# Patient Record
Sex: Male | Born: 1980 | Race: White | Hispanic: No | Marital: Married | Smoking: Former smoker
Health system: Southern US, Community
[De-identification: ages and names within clinical notes are randomized; demographics above are authoritative.]

## PROBLEM LIST (undated history)

## (undated) DIAGNOSIS — I10 Essential (primary) hypertension: Secondary | ICD-10-CM

## (undated) DIAGNOSIS — E039 Hypothyroidism, unspecified: Secondary | ICD-10-CM

## (undated) DIAGNOSIS — F410 Panic disorder [episodic paroxysmal anxiety] without agoraphobia: Secondary | ICD-10-CM

## (undated) DIAGNOSIS — F419 Anxiety disorder, unspecified: Secondary | ICD-10-CM

## (undated) DIAGNOSIS — Z8719 Personal history of other diseases of the digestive system: Secondary | ICD-10-CM

## (undated) DIAGNOSIS — E669 Obesity, unspecified: Secondary | ICD-10-CM

## (undated) HISTORY — DX: Personal history of other diseases of the digestive system: Z87.19

## (undated) HISTORY — DX: Obesity, unspecified: E66.9

## (undated) HISTORY — DX: Anxiety disorder, unspecified: F41.9

## (undated) HISTORY — DX: Essential (primary) hypertension: I10

## (undated) HISTORY — DX: Panic disorder (episodic paroxysmal anxiety): F41.0

## (undated) HISTORY — DX: Hypothyroidism, unspecified: E03.9

---

## 2003-09-16 ENCOUNTER — Other Ambulatory Visit: Payer: Self-pay

## 2005-08-19 ENCOUNTER — Ambulatory Visit: Payer: Self-pay | Admitting: Family Medicine

## 2005-11-20 ENCOUNTER — Other Ambulatory Visit: Payer: Self-pay

## 2005-11-20 ENCOUNTER — Emergency Department: Payer: Self-pay | Admitting: Unknown Physician Specialty

## 2005-11-23 ENCOUNTER — Emergency Department: Payer: Self-pay | Admitting: Emergency Medicine

## 2005-11-23 ENCOUNTER — Other Ambulatory Visit: Payer: Self-pay

## 2005-12-24 ENCOUNTER — Emergency Department: Payer: Self-pay | Admitting: Emergency Medicine

## 2005-12-24 ENCOUNTER — Other Ambulatory Visit: Payer: Self-pay

## 2006-12-26 ENCOUNTER — Other Ambulatory Visit: Payer: Self-pay

## 2006-12-26 ENCOUNTER — Emergency Department: Payer: Self-pay | Admitting: Emergency Medicine

## 2006-12-27 ENCOUNTER — Ambulatory Visit: Payer: Self-pay | Admitting: Emergency Medicine

## 2007-09-05 ENCOUNTER — Emergency Department: Payer: Self-pay | Admitting: Emergency Medicine

## 2007-09-05 ENCOUNTER — Other Ambulatory Visit: Payer: Self-pay

## 2007-09-06 ENCOUNTER — Ambulatory Visit: Payer: Self-pay | Admitting: Emergency Medicine

## 2009-01-05 ENCOUNTER — Emergency Department: Payer: Self-pay | Admitting: Emergency Medicine

## 2009-06-08 ENCOUNTER — Emergency Department: Payer: Self-pay | Admitting: Emergency Medicine

## 2009-08-23 HISTORY — PX: COLONOSCOPY: SHX174

## 2009-10-20 ENCOUNTER — Emergency Department: Payer: Self-pay | Admitting: Emergency Medicine

## 2010-01-22 ENCOUNTER — Ambulatory Visit: Payer: Self-pay | Admitting: Gastroenterology

## 2011-02-08 ENCOUNTER — Emergency Department: Payer: Self-pay | Admitting: Emergency Medicine

## 2011-05-25 ENCOUNTER — Emergency Department: Payer: Self-pay | Admitting: Emergency Medicine

## 2011-09-12 ENCOUNTER — Emergency Department: Payer: Self-pay | Admitting: Emergency Medicine

## 2011-09-16 ENCOUNTER — Emergency Department: Payer: Self-pay | Admitting: Unknown Physician Specialty

## 2011-09-16 LAB — URINALYSIS, COMPLETE
Bilirubin,UR: NEGATIVE
Glucose,UR: NEGATIVE mg/dL (ref 0–75)
Ketone: NEGATIVE
Leukocyte Esterase: NEGATIVE
RBC,UR: <1 /HPF (ref 0–5)
Squamous Epithelial: NONE SEEN
WBC UR: NONE SEEN /HPF (ref 0–5)

## 2011-09-16 LAB — COMPREHENSIVE METABOLIC PANEL
Albumin: 3.8 g/dL (ref 3.4–5.0)
Calcium, Total: 8.5 mg/dL (ref 8.5–10.1)
Chloride: 105 mmol/L (ref 98–107)
EGFR (African American): >60
EGFR (Non-African Amer.): >60
Osmolality: 286 (ref 275–301)
Potassium: 4.4 mmol/L (ref 3.5–5.1)
SGOT(AST): 33 U/L (ref 15–37)
Sodium: 142 mmol/L (ref 136–145)
Total Protein: 7 g/dL (ref 6.4–8.2)

## 2011-09-16 LAB — PROTIME-INR: INR: 1

## 2011-09-16 LAB — CBC
HCT: 40.5 % (ref 40.0–52.0)
MCH: 29.2 pg (ref 26.0–34.0)
Platelet: 202 10*3/uL (ref 150–440)

## 2011-09-16 LAB — CK TOTAL AND CKMB (NOT AT ARMC): CK-MB: 1 ng/mL (ref 0.5–3.6)

## 2011-10-21 ENCOUNTER — Emergency Department: Payer: Self-pay | Admitting: Emergency Medicine

## 2011-10-21 LAB — CBC
HGB: 14.3 g/dL (ref 13.0–18.0)
MCV: 86 fL (ref 80–100)
Platelet: 203 10*3/uL (ref 150–440)
RBC: 4.84 10*6/uL (ref 4.40–5.90)
WBC: 7.2 10*3/uL (ref 3.8–10.6)

## 2011-10-21 LAB — COMPREHENSIVE METABOLIC PANEL
Alkaline Phosphatase: 53 U/L (ref 50–136)
Anion Gap: 12 (ref 7–16)
Bilirubin,Total: 0.5 mg/dL (ref 0.2–1.0)
Calcium, Total: 8.6 mg/dL (ref 8.5–10.1)
Chloride: 103 mmol/L (ref 98–107)
Co2: 28 mmol/L (ref 21–32)
Creatinine: 0.99 mg/dL (ref 0.60–1.30)
EGFR (Non-African Amer.): >60
Osmolality: 288 (ref 275–301)
Potassium: 4.2 mmol/L (ref 3.5–5.1)
Sodium: 143 mmol/L (ref 136–145)

## 2012-02-04 ENCOUNTER — Emergency Department: Payer: Self-pay | Admitting: Emergency Medicine

## 2012-02-04 LAB — CBC
HGB: 14.6 g/dL (ref 13.0–18.0)
MCH: 29.6 pg (ref 26.0–34.0)
WBC: 6.6 10*3/uL (ref 3.8–10.6)

## 2012-02-04 LAB — BASIC METABOLIC PANEL
Anion Gap: 10 (ref 7–16)
BUN: 23 mg/dL — ABNORMAL HIGH (ref 7–18)
Calcium, Total: 8.6 mg/dL (ref 8.5–10.1)
Chloride: 108 mmol/L — ABNORMAL HIGH (ref 98–107)
Co2: 24 mmol/L (ref 21–32)
Creatinine: 1.11 mg/dL (ref 0.60–1.30)
EGFR (African American): >60
EGFR (Non-African Amer.): >60
Osmolality: 287 (ref 275–301)
Potassium: 4.1 mmol/L (ref 3.5–5.1)
Sodium: 142 mmol/L (ref 136–145)

## 2012-02-04 LAB — CK TOTAL AND CKMB (NOT AT ARMC): CK-MB: 0.9 ng/mL (ref 0.5–3.6)

## 2012-02-07 ENCOUNTER — Encounter: Payer: Self-pay | Admitting: Cardiovascular Disease

## 2012-02-10 ENCOUNTER — Encounter: Payer: Self-pay | Admitting: Cardiovascular Disease

## 2012-02-10 ENCOUNTER — Ambulatory Visit (INDEPENDENT_AMBULATORY_CARE_PROVIDER_SITE_OTHER): Payer: Medicaid Other | Admitting: Cardiovascular Disease

## 2012-02-10 VITALS — BP 134/80 | HR 66 | Ht 71.0 in | Wt 274.0 lb

## 2012-02-10 DIAGNOSIS — R0600 Dyspnea, unspecified: Secondary | ICD-10-CM

## 2012-02-10 DIAGNOSIS — R0609 Other forms of dyspnea: Secondary | ICD-10-CM

## 2012-02-10 DIAGNOSIS — R079 Chest pain, unspecified: Secondary | ICD-10-CM

## 2012-02-10 NOTE — Progress Notes (Signed)
HPI  This is a 31 year old male who was referred by Dr. Arlana Pouch for evaluation of chest pain. The patient has no previous cardiac history. He has past medical history of hypertension obesity and anxiety. He has been having chest pain over the last few months. He describes substernal aching sensation without radiation. It lasts for a few minutes. It mostly happens at rest and does not worsen with physical activities. He is not sure if he has heartburn symptoms. He has no history of gastroesophageal reflux disease. When he gets these episodes, he is usually very anxious and stressed. He also complains of significant exertional dyspnea and he always attributed that to his obesity and being deconditioned. 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 According to his wife, he does snore loudly at night and feels tired during the day. He is not aware of history of sleep apnea.  No Known Allergies   Current Outpatient Prescriptions on File Prior to Visit  Medication Sig Dispense Refill  . clonazePAM (KLONOPIN) 1 MG tablet Take 1 mg by mouth at bedtime as needed.      [name] levothyroxine (SYNTHROID, LEVOTHROID) 50 MCG tablet Take 1/2 tablet daily.      . metoprolol tartrate (LOPRESSOR) 25 MG tablet Take 25 mg by mouth 2 (two) times daily.         Past Medical History  Diagnosis Date  . Hypertension   . Panic attacks   . Hypothyroidism   . Anxiety   . History of hemorrhoids   . Obesity      Past Surgical History  Procedure Date  . Colonoscopy 2011     Family History  Problem Relation Age of Onset  . Heart attack Maternal Uncle 55    MI s/p CABG  . Heart attack Maternal Grandfather 60    MI     History   Social History  . Marital Status: Married    Spouse Name: N/A    Number of Children: N/A  . Years of Education: N/A   Occupational History  . Not on file.   Social History Main Topics  . Smoking status: Former Smoker -- 3.0 packs/day for 5 years    Types: Cigarettes  .  Smokeless tobacco: Current User    Types: Chew  . Alcohol Use: Yes     Drinks an 18 pack of beer on weekends.  . Drug Use: No     smoked marijuana on occas. at a party.  . Sexually Active: Not on file   Other Topics Concern  . Not on file   Social History Narrative  . No narrative on file     ROS Constitutional: Negative for fever, chills, diaphoresis, activity change, appetite change.  HENT: Negative for hearing loss, nosebleeds, congestion, sore throat, facial swelling, drooling, trouble swallowing, neck pain, voice change, sinus pressure and tinnitus.  Eyes: Negative for photophobia, pain, discharge and visual disturbance.  Respiratory: Negative for apnea, cough and wheezing.  Cardiovascular: Negative for palpitations and leg swelling.  Gastrointestinal: Negative for nausea, vomiting, abdominal pain, diarrhea, constipation, blood in stool and abdominal distention.  Genitourinary: Negative for dysuria, urgency, frequency, hematuria and decreased urine volume.  Musculoskeletal: Negative for myalgias, back pain, joint swelling, arthralgias and gait problem.  Skin: Negative for color change, pallor, rash and wound.  Neurological: Negative for dizziness, tremors, seizures, syncope, speech difficulty, weakness, light-headedness, numbness and headaches.  Psychiatric/Behavioral: Negative for suicidal ideas, hallucinations, behavioral problems and agitation. The patient is not  nervous/anxious.     PHYSICAL EXAM   BP 134/80  Pulse 66  Ht 5\' 11"  (1.803 m)  Wt 274 lb (124.286 kg)  BMI 38.22 kg/m2 Constitutional: He is oriented to person, place, and time. He appears well-developed and well-nourished. No distress.  HENT: No nasal discharge.  Head: Normocephalic and atraumatic.  Eyes: Pupils are equal and round. Right eye exhibits no discharge. Left eye exhibits no discharge.  Neck: Normal range of motion. Neck supple. No JVD present. No thyromegaly present.  Cardiovascular: Normal  rate, regular rhythm, normal heart sounds and. Exam reveals no gallop and no friction rub. No murmur heard.  Pulmonary/Chest: Effort normal and breath sounds normal. No stridor. No respiratory distress. He has no wheezes. He has no rales. He exhibits no tenderness.  Abdominal: Soft. Bowel sounds are normal. He exhibits no distension. There is no tenderness. There is no rebound and no guarding.  Musculoskeletal: Normal range of motion. He exhibits no edema and no tenderness.  Neurological: He is alert and oriented to person, place, and time. Coordination normal.  Skin: Skin is warm and dry. No rash noted. He is not diaphoretic. No erythema. No pallor.  Psychiatric: He has a normal mood and affect. His behavior is normal. Judgment and thought content normal.       EKG: Sinus  Rhythm  WITHIN NORMAL LIMITS   ASSESSMENT AND PLAN

## 2012-02-10 NOTE — Patient Instructions (Addendum)
Your stress test was normal.  Schedule an echo

## 2012-02-10 NOTE — Assessment & Plan Note (Addendum)
The patient's chest pain is overall atypical. It's likely due to anxiety. However, he does have risk factors for coronary artery disease. His baseline ECG is within normal limits. Thus, I recommend evaluation with a treadmill stress test. I discussed with the patient the importance of lifestyle changes in order to decrease the chance of future coronary artery disease and cardiovascular events. We discussed the importance of controlling risk factors, healthy diet as well as regular exercise. I also explained to him that a normal stress test does not rule out atherosclerosis.   He underwent a treadmill stress test after his office visit which showed no evidence of ischemia.

## 2012-02-10 NOTE — Procedures (Signed)
    Treadmill Stress test  Indication: Atypical chest pain.  Baseline Data:  Resting EKG shows NSR with rate of 64 bpm, no significant ST or T wave changes. Resting blood pressure of 134/80 mm Hg Stand bruce protocal was used.  Exercise Data:  Patient exercised for 9 min 42 sec,  Peak heart rate of 149 bpm.  This was 79 % of the maximum predicted heart rate. No symptoms of chest pain or lightheadedness were reported at peak stress or in recovery.  Peak Blood pressure recorded was 158/84 Maximal work level: 10.1 METs.  Heart rate at 3 minutes in recovery was 91 bpm. BP response: Normal HR response: Blunted due to being on a beta blocker.  EKG with Exercise: Sinus tachycardia with no significant ST or T wave changes.  FINAL IMPRESSION: Normal exercise stress test at slightly submaximal workload. No significant EKG changes concerning for ischemia. Good exercise tolerance.  Recommendation: The chest pain is likely noncardiac.  '

## 2012-02-10 NOTE — Patient Instructions (Addendum)
Your physician has requested that you have an exercise tolerance test. For further information please visit [email or web address] Please also follow instruction sheet, as given.  Your physician has requested that you have an echocardiogram. Echocardiography is a painless test that uses sound waves to create images of your heart. It provides your doctor with information about the size and shape of your heart and how well your heart's chambers and valves are working. This procedure takes approximately one hour. There are no restrictions for this procedure.  You might have sleep apnea.   Follow up as needed.

## 2012-02-10 NOTE — Assessment & Plan Note (Signed)
Patient has significant exertional dyspnea likely due to physical deconditioning and obesity. His previous ECG showed borderline criteria for LVH. I recommend a transthoracic echocardiogram. He has some symptoms suggestive of sleep apnea. Consider evaluation with a sleep study.

## 2012-02-16 ENCOUNTER — Other Ambulatory Visit: Payer: Self-pay

## 2012-02-16 ENCOUNTER — Other Ambulatory Visit (INDEPENDENT_AMBULATORY_CARE_PROVIDER_SITE_OTHER): Payer: Medicaid Other

## 2012-02-16 DIAGNOSIS — R0600 Dyspnea, unspecified: Secondary | ICD-10-CM

## 2012-02-16 DIAGNOSIS — R079 Chest pain, unspecified: Secondary | ICD-10-CM

## 2012-02-16 DIAGNOSIS — R0989 Other specified symptoms and signs involving the circulatory and respiratory systems: Secondary | ICD-10-CM

## 2012-02-21 ENCOUNTER — Telehealth: Payer: Self-pay | Admitting: Cardiovascular Disease

## 2012-02-21 NOTE — Telephone Encounter (Signed)
Pt's wife, Elmarie Shiley, was given results.

## 2012-02-21 NOTE — Telephone Encounter (Signed)
Pt wife rtn call to [place] from Friday to get test results

## 2012-02-21 NOTE — Telephone Encounter (Signed)
Pt was notified of test results and gave a verbal ok for results to be given to his wife.

## 2012-04-18 ENCOUNTER — Ambulatory Visit: Payer: Self-pay | Admitting: Unknown Physician Specialty

## 2012-08-28 ENCOUNTER — Emergency Department: Payer: Self-pay | Admitting: Emergency Medicine

## 2012-08-28 LAB — COMPREHENSIVE METABOLIC PANEL
Albumin: 4 g/dL (ref 3.4–5.0)
Anion Gap: 6 — ABNORMAL LOW (ref 7–16)
BUN: 16 mg/dL (ref 7–18)
Bilirubin,Total: 0.5 mg/dL (ref 0.2–1.0)
Calcium, Total: 8.3 mg/dL — ABNORMAL LOW (ref 8.5–10.1)
Chloride: 107 mmol/L (ref 98–107)
Creatinine: 1.05 mg/dL (ref 0.60–1.30)
EGFR (Non-African Amer.): >60
Glucose: 98 mg/dL (ref 65–99)
Osmolality: 282 (ref 275–301)
Potassium: 4.6 mmol/L (ref 3.5–5.1)
SGPT (ALT): 48 U/L (ref 12–78)
Sodium: 141 mmol/L (ref 136–145)

## 2012-08-28 LAB — CBC
HCT: 45 % (ref 40.0–52.0)
MCH: 29 pg (ref 26.0–34.0)
Platelet: 230 10*3/uL (ref 150–440)
RBC: 5.25 10*6/uL (ref 4.40–5.90)
RDW: 13.1 % (ref 11.5–14.5)
WBC: 5.2 10*3/uL (ref 3.8–10.6)

## 2012-08-28 LAB — CK TOTAL AND CKMB (NOT AT ARMC): CK, Total: 105 U/L (ref 35–232)

## 2012-08-28 LAB — TROPONIN I: Troponin-I: <0.02 ng/mL

## 2012-08-28 LAB — PROTIME-INR
INR: 0.9
Prothrombin Time: 12.9 secs (ref 11.5–14.7)

## 2014-04-28 ENCOUNTER — Emergency Department: Payer: Self-pay | Admitting: Emergency Medicine

## 2014-04-29 LAB — COMPREHENSIVE METABOLIC PANEL
Anion Gap: 7 (ref 7–16)
BUN: 18 mg/dL (ref 7–18)
Calcium, Total: 8.2 mg/dL — ABNORMAL LOW (ref 8.5–10.1)
Chloride: 106 mmol/L (ref 98–107)
Co2: 28 mmol/L (ref 21–32)
Creatinine: 1.15 mg/dL (ref 0.60–1.30)
EGFR (African American): >60
EGFR (Non-African Amer.): >60
GLUCOSE: 138 mg/dL — AB (ref 65–99)
Osmolality: 285 (ref 275–301)
Potassium: 3.7 mmol/L (ref 3.5–5.1)
Sodium: 141 mmol/L (ref 136–145)

## 2014-04-29 LAB — HEPATIC FUNCTION PANEL A (ARMC)
ALBUMIN: 3.5 g/dL (ref 3.4–5.0)
ALK PHOS: 63 U/L
AST: 33 U/L (ref 15–37)
Bilirubin, Direct: <0.1 mg/dL (ref 0.00–0.20)
Bilirubin,Total: 0.3 mg/dL (ref 0.2–1.0)
SGPT (ALT): 39 U/L
Total Protein: 7 g/dL (ref 6.4–8.2)

## 2014-04-29 LAB — CBC
HCT: 41.5 % (ref 40.0–52.0)
HGB: 14.1 g/dL (ref 13.0–18.0)
MCH: 30.1 pg (ref 26.0–34.0)
MCHC: 33.9 g/dL (ref 32.0–36.0)
MCV: 89 fL (ref 80–100)
Platelet: 223 10*3/uL (ref 150–440)
RBC: 4.67 10*6/uL (ref 4.40–5.90)
RDW: 13.2 % (ref 11.5–14.5)
WBC: 6.7 10*3/uL (ref 3.8–10.6)

## 2014-04-29 LAB — TROPONIN I

## 2015-03-19 ENCOUNTER — Ambulatory Visit
Admission: RE | Admit: 2015-03-19 | Discharge: 2015-03-19 | Disposition: A | Discharge status: Home / Self Care | Payer: Medicaid Other | Source: Ambulatory Visit | Attending: Internal Medicine | Admitting: Internal Medicine

## 2015-03-19 ENCOUNTER — Other Ambulatory Visit: Payer: Self-pay | Admitting: Internal Medicine

## 2015-03-19 DIAGNOSIS — M79676 Pain in unspecified toe(s): Secondary | ICD-10-CM

## 2015-03-19 DIAGNOSIS — M109 Gout, unspecified: Secondary | ICD-10-CM | POA: Insufficient documentation

## 2015-03-19 DIAGNOSIS — M25572 Pain in left ankle and joints of left foot: Secondary | ICD-10-CM | POA: Diagnosis present

## 2015-12-16 ENCOUNTER — Emergency Department
Admission: EM | Admit: 2015-12-16 | Discharge: 2015-12-16 | Disposition: A | Discharge status: Home / Self Care | Payer: Medicaid Other | Attending: Emergency Medicine | Admitting: Emergency Medicine

## 2015-12-16 DIAGNOSIS — E039 Hypothyroidism, unspecified: Secondary | ICD-10-CM | POA: Insufficient documentation

## 2015-12-16 DIAGNOSIS — I10 Essential (primary) hypertension: Secondary | ICD-10-CM | POA: Insufficient documentation

## 2015-12-16 DIAGNOSIS — L600 Ingrowing nail: Secondary | ICD-10-CM

## 2015-12-16 DIAGNOSIS — E669 Obesity, unspecified: Secondary | ICD-10-CM | POA: Insufficient documentation

## 2015-12-16 DIAGNOSIS — Z720 Tobacco use: Secondary | ICD-10-CM | POA: Insufficient documentation

## 2015-12-16 DIAGNOSIS — Z7982 Long term (current) use of aspirin: Secondary | ICD-10-CM | POA: Insufficient documentation

## 2015-12-16 DIAGNOSIS — Z79899 Other long term (current) drug therapy: Secondary | ICD-10-CM | POA: Insufficient documentation

## 2015-12-16 MED ORDER — HYDROCODONE-ACETAMINOPHEN 5-325 MG PO TABS
1.0000 | ORAL_TABLET | Freq: Once | ORAL | Status: AC
Start: 2015-12-16 — End: 2015-12-16
  Administered 2015-12-16: 1 via ORAL
  Filled 2015-12-16: qty 1

## 2015-12-16 MED ORDER — CEPHALEXIN 500 MG PO CAPS
500.0000 mg | ORAL_CAPSULE | Freq: Once | ORAL | Status: AC
  Administered 2015-12-16: 500 mg via ORAL
  Filled 2015-12-16: qty 1

## 2015-12-16 NOTE — ED Notes (Signed)
Pt presents with ingrown toenail. Has [facility] with podiatrist today [facility]

## 2015-12-16 NOTE — ED Notes (Signed)
Pt discharged home after verbalizing understanding of discharge instructions; nad noted. 

## 2015-12-16 NOTE — ED Provider Notes (Signed)
[facility] Emergency Department Provider Note ____________________________________________  Time seen: Approximately 10:19 AM  I have reviewed the triage vital signs and the nursing notes.   HISTORY  Chief Complaint Toe Pain    HPI Francisco Stuart is a 35 y.o. male who presents to the emergency department for evaluation of an ingrown toenail. He states he attempted to "dig it out with a pocket knife" but he couldn't get it out. He states he has had similar symptoms in the past. He has not taken any medication.  Past Medical History  Diagnosis Date  . Hypertension   . Panic attacks   . Hypothyroidism   . Anxiety   . History of hemorrhoids   . Obesity     Patient Active Problem List   Diagnosis Date Noted  . Chest pain 02/10/2012  . Dyspnea 02/10/2012    Past Surgical History  Procedure Laterality Date  . Colonoscopy  2011    Current Outpatient Rx  Name  Route  Sig  Dispense  Refill  . aspirin 81 MG tablet   Oral   Take 81 mg by mouth daily.         . clonazePAM (KLONOPIN) 1 MG tablet   Oral   Take 1 mg by mouth at bedtime as needed.         [name]. levothyroxine (SYNTHROID, LEVOTHROID) 50 MCG tablet      Take 1/2 tablet daily.         . metoprolol tartrate (LOPRESSOR) 25 MG tablet   Oral   Take 25 mg by mouth 2 (two) times daily.           Allergies Review of patient's allergies indicates no known allergies.  Family History  Problem Relation Age of Onset  . Heart attack Maternal Uncle 55    MI s/p CABG  . Heart attack Maternal Grandfather [phone]    MI    Social History Social History  Substance Use Topics  . Smoking status: Former Smoker -- 3.00 packs/day for 5 years    Types: Cigarettes  . Smokeless tobacco: Current User    Types: Chew  . Alcohol Use: Yes     Comment: Drinks an 18 pack of beer on weekends.    Review of Systems Constitutional: No recent illness. Musculoskeletal: Pain in left great toe Skin:  Positive for swelling of the left great toe. Neurological: Negative for focal weakness or numbness. ____________________________________________   PHYSICAL EXAM:  VITAL SIGNS: ED Triage Vitals  Enc Vitals Group     BP 12/16/15 0937 136/79 mmHg     Pulse Rate 12/16/15 0937 73     Resp 12/16/15 0937 20     Temp 12/16/15 0937 97.6 F (36.4 C)     Temp Source 12/16/15 0937 Oral     SpO2 12/16/15 0937 100 %     Weight 12/16/15 0933 296 lb (134.265 kg)     Height 12/16/15 0933 6' (1.829 m)     Head Cir --      Peak Flow --      Pain Score 12/16/15 0934 10     Pain Loc --      Pain Edu? --      Excl. in GC? --     Constitutional: Alert and oriented. Well appearing and in no acute distress. Eyes: Conjunctivae are normal. EOMI. Respiratory: Normal respiratory effort.   Musculoskeletal: Full ROM of all extremities. Neurologic:  Normal speech and language. No gross focal  neurologic deficits are appreciated. Speech is normal. No gait instability. Skin: Left great toenail ingrowing on the medial aspect with diffuse fungal nail thickening affecting all nails on the left foot. Mild, localized erythema without pustular drainage on the medial aspect of the left great toe.  Psychiatric: Mood and affect are normal. Speech and behavior are normal.  ____________________________________________   LABS (all labs ordered are listed, but only abnormal results are displayed)  Labs Reviewed - No data to display ____________________________________________  RADIOLOGY  Not indicated ____________________________________________   PROCEDURES  Procedure(s) performed: None   ____________________________________________   INITIAL IMPRESSION / ASSESSMENT AND PLAN / ED COURSE  Pertinent labs & imaging results that were available during my care of the patient were reviewed by me and considered in my medical decision making (see chart for details).  Patient was advised to see his podiatrist  as scheduled today. He was given a Keflex and Norco while here. He agrees to follow up as scheduled. He will return to the ER for symptoms of concern if unable to see the podiatrist or his PCP. ____________________________________________   FINAL CLINICAL IMPRESSION(S) / ED DIAGNOSES  Final diagnoses:  Ingrown left big toenail       Chinita Pester, FNP 12/16/15 1625  Jennye Moccasin, MD 12/18/15 1455

## 2015-12-16 NOTE — ED Notes (Signed)
Pt c/o left great toe pain with redness and swelling. Possible engrown toenail

## 2015-12-16 NOTE — Discharge Instructions (Signed)
Ingrown Toenail  An ingrown toenail occurs when the corner or sides of your toenail grow into the surrounding skin. The big toe is most commonly affected, but it can happen to any of your toes. If your ingrown toenail is not treated, you will be at risk for infection.  CAUSES  This condition may be caused by:  · Wearing shoes that are too small or tight.  · Injury or trauma, such as stubbing your toe or having your toe stepped on.  · Improper cutting or care of your toenails.  · Being born with (congenital) nail or foot abnormalities, such as having a nail that is too big for your toe.  RISK FACTORS  Risk factors for an ingrown toenail include:  · Age. Your nails tend to thicken as you get older, so ingrown nails are more common in older people.  · Diabetes.  · Cutting your toenails incorrectly.  · Blood circulation problems.  SYMPTOMS  Symptoms may include:  · Pain, soreness, or tenderness.  · Redness.  · Swelling.  · Hardening of the skin surrounding the toe.  Your ingrown toenail may be infected if there is fluid, pus, or drainage.  DIAGNOSIS   An ingrown toenail may be diagnosed by medical history and physical exam. If your toenail is infected, your health care provider may test a sample of the drainage.  TREATMENT  Treatment depends on the severity of your ingrown toenail. Some ingrown toenails may be treated at home. More severe or infected ingrown toenails may require surgery to remove all or part of the nail. Infected ingrown toenails may also be treated with antibiotic medicines.  HOME CARE INSTRUCTIONS  · If you were prescribed an antibiotic medicine, finish all of it even if you start to feel better.  · Soak your foot in warm soapy water for 20 minutes, 3 times per day or as directed by your health care provider.  · Carefully lift the edge of the nail away from the sore skin by wedging a small piece of cotton under the corner of the nail. This may help with the pain.  Be careful not to cause more injury  to the area.  · Wear shoes that fit well. If your ingrown toenail is causing you pain, try wearing sandals, if possible.  · Trim your toenails regularly and carefully. Do not cut them in a curved shape. Cut your toenails straight across. This prevents injury to the skin at the corners of the toenail.  · Keep your feet clean and dry.  · If you are having trouble walking and are given crutches by your health care provider, use them as directed.  · Do not pick at your toenail or try to remove it yourself.  · Take medicines only as directed by your health care provider.  · Keep all follow-up visits as directed by your health care provider. This is important.  SEEK MEDICAL CARE IF:  · Your symptoms do not improve with treatment.  SEEK IMMEDIATE MEDICAL CARE IF:  · You have red streaks that start at your foot and go up your leg.  · You have a fever.  · You have increased redness, swelling, or pain.  · You have fluid, blood, or pus coming from your toenail.     This information is not intended to replace advice given to you by your health care provider. Make sure you discuss any questions you have with your health care provider.     Document Released:   08/06/2000 Document Revised: 12/24/2014 Document Reviewed: 07/03/2014  Elsevier Interactive Patient Education ©2016 [employer]

## 2016-02-02 ENCOUNTER — Encounter: Payer: Self-pay | Admitting: Emergency Medicine

## 2016-02-02 DIAGNOSIS — I10 Essential (primary) hypertension: Secondary | ICD-10-CM | POA: Insufficient documentation

## 2016-02-02 DIAGNOSIS — M79672 Pain in left foot: Secondary | ICD-10-CM | POA: Insufficient documentation

## 2016-02-02 DIAGNOSIS — M722 Plantar fascial fibromatosis: Secondary | ICD-10-CM | POA: Insufficient documentation

## 2016-02-02 DIAGNOSIS — Z7982 Long term (current) use of aspirin: Secondary | ICD-10-CM | POA: Insufficient documentation

## 2016-02-02 DIAGNOSIS — Z87891 Personal history of nicotine dependence: Secondary | ICD-10-CM | POA: Insufficient documentation

## 2016-02-02 DIAGNOSIS — E039 Hypothyroidism, unspecified: Secondary | ICD-10-CM | POA: Insufficient documentation

## 2016-02-02 NOTE — ED Notes (Signed)
Pt presents to ED with c/o left foot/ankle pain for long time, worse yesterday. No new injury or deformity noted.

## 2016-02-03 ENCOUNTER — Emergency Department
Admission: EM | Admit: 2016-02-03 | Discharge: 2016-02-03 | Disposition: A | Discharge status: Home / Self Care | Payer: Medicaid Other | Attending: Emergency Medicine | Admitting: Emergency Medicine

## 2016-02-03 ENCOUNTER — Emergency Department: Payer: Medicaid Other

## 2016-02-03 DIAGNOSIS — M722 Plantar fascial fibromatosis: Secondary | ICD-10-CM

## 2016-02-03 DIAGNOSIS — M79672 Pain in left foot: Secondary | ICD-10-CM

## 2016-02-03 MED ORDER — OXYCODONE-ACETAMINOPHEN 5-325 MG PO TABS: 1.0000 | ORAL_TABLET | ORAL | Status: DC | PRN

## 2016-02-03 MED ORDER — KETOROLAC TROMETHAMINE 30 MG/ML IJ SOLN
60.0000 mg | Freq: Once | INTRAMUSCULAR | Status: AC
  Administered 2016-02-03: 60 mg via INTRAMUSCULAR
  Filled 2016-02-03: qty 2

## 2016-02-03 MED ORDER — IBUPROFEN 800 MG PO TABS: 800.0000 mg | ORAL_TABLET | Freq: Three times a day (TID) | ORAL | Status: AC | PRN

## 2016-02-03 NOTE — ED Provider Notes (Signed)
[facility] Emergency Department Provider Note   ____________________________________________  Time seen: Approximately 4:33 AM  I have reviewed the triage vital signs and the nursing notes.   HISTORY  Chief Complaint Foot Pain    HPI Guadelupe SabinChristopher L Stuart is a 35 y.o. male who presents to the ED from home with a chief complaint of left foot pain. Patient reports onset of pain to left instep yesterday morning on awakening. Painful to bear weight. Denies recent trauma. Reports prior issues with gout but states this pain does not feel similar. Denies recent fever, chills, chest pain, shortness of breath, abdominal pain, nausea, vomiting, diarrhea. 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 Nothing makes the pain better. Bearing weight makes the pain worse.   Past Medical History  Diagnosis Date  . Hypertension   . Panic attacks   . Hypothyroidism   . Anxiety   . History of hemorrhoids   . Obesity     Patient Active Problem List   Diagnosis Date Noted  . Chest pain 02/10/2012  . Dyspnea 02/10/2012    Past Surgical History  Procedure Laterality Date  . Colonoscopy  2011    Current Outpatient Rx  Name  Route  Sig  Dispense  Refill  . aspirin 81 MG tablet   Oral   Take 81 mg by mouth daily.         . clonazePAM (KLONOPIN) 1 MG tablet   Oral   Take 1 mg by mouth at bedtime as needed.         [name]. ibuprofen (ADVIL,MOTRIN) 800 MG tablet   Oral   Take 1 tablet (800 mg total) by mouth every 8 (eight) hours as needed for moderate pain.   15 tablet   0   . levothyroxine (SYNTHROID, LEVOTHROID) 50 MCG tablet      Take 1/2 tablet daily.         . metoprolol tartrate (LOPRESSOR) 25 MG tablet   Oral   Take 25 mg by mouth 2 (two) times daily.         [name]. oxyCODONE-acetaminophen (ROXICET) 5-325 MG tablet   Oral   Take 1 tablet by mouth every 4 (four) hours as needed for severe pain.   15 tablet   0     Allergies Review  of patient's allergies indicates no known allergies.  Family History  Problem Relation Age of Onset  . Heart attack Maternal Uncle 55    MI s/p CABG  . Heart attack Maternal Grandfather [phone]    MI    Social History Social History  Substance Use Topics  . Smoking status: Former Smoker -- 3.00 packs/day for 5 years    Types: Cigarettes  . Smokeless tobacco: Current User    Types: Chew  . Alcohol Use: Yes     Comment: Drinks an 18 pack of beer on weekends.    Review of Systems  Constitutional: No fever/chills. Eyes: No visual changes. ENT: No sore throat. Cardiovascular: Denies chest pain. Respiratory: Denies shortness of breath. Gastrointestinal: No abdominal pain.  No nausea, no vomiting.  No diarrhea.  No constipation. Genitourinary: Negative for dysuria. Musculoskeletal: Positive for left foot pain. Negative for back pain. Skin: Negative for rash. Neurological: Negative for headaches, focal weakness or numbness.  10-point ROS otherwise negative.  ____________________________________________   PHYSICAL EXAM:  VITAL SIGNS: ED Triage Vitals  Enc Vitals Group     BP 02/02/16 2308 162/86 mmHg  Pulse Rate 02/02/16 2308 73     Resp 02/02/16 2308 18     Temp 02/02/16 2308 97.7 F (36.5 C)     Temp Source 02/02/16 2308 Oral     SpO2 02/02/16 2308 98 %     Weight 02/02/16 2308 293 lb (132.904 kg)     Height 02/02/16 2308 6' (1.829 m)     Head Cir --      Peak Flow --      Pain Score 02/02/16 2324 3     Pain Loc --      Pain Edu? --      Excl. in GC? --     Constitutional: Alert and oriented. Well appearing and in no acute distress. Eyes: Conjunctivae are normal. PERRL. EOMI. Head: Atraumatic. Nose: No congestion/rhinnorhea. Mouth/Throat: Mucous membranes are moist.  Oropharynx non-erythematous. Neck: No stridor.   Cardiovascular: Normal rate, regular rhythm. Grossly normal heart sounds.  Good peripheral circulation. Respiratory: Normal respiratory effort.   No retractions. Lungs CTAB. Gastrointestinal: Soft and nontender. No distention. No abdominal bruits. No CVA tenderness. Musculoskeletal: Pain to left instep and sole of foot on palpation. No external evidence of injury noted. No edema.  No joint effusions. Supple calf. 2+ distal pulses. Neurologic:  Normal speech and language. No gross focal neurologic deficits are appreciated.  Skin:  Skin is warm, dry and intact. No rash noted. Psychiatric: Mood and affect are normal. Speech and behavior are normal.  ____________________________________________   LABS (all labs ordered are listed, but only abnormal results are displayed)  Labs Reviewed - No data to display ____________________________________________  EKG  None ____________________________________________  RADIOLOGY  Left foot x-rays (viewed by me, interpreted per Dr. Andria Meuse): Small focal periarticular erosion with soft tissue swelling at the first metatarsal-phalangeal joint suggesting changes of gout. Similar appearance to previous study. ____________________________________________   PROCEDURES  Procedure(s) performed: None  Critical Care performed: No  ____________________________________________   INITIAL IMPRESSION / ASSESSMENT AND PLAN / ED COURSE  Pertinent labs & imaging results that were available during my care of the patient were reviewed by me and considered in my medical decision making (see chart for details).  35 year old male who presents with left foot pain consistent with plantar fasciitis. Plan for NSAIDs, analgesia, Ace wrap, crutches and patient will follow-up with his podiatrist. Strict return precautions given. Patient verbalizes understanding and agrees with plan of care. ____________________________________________   FINAL CLINICAL IMPRESSION(S) / ED DIAGNOSES  Final diagnoses:  Left foot pain  Plantar fasciitis, left      NEW MEDICATIONS STARTED DURING THIS VISIT:  New  Prescriptions   IBUPROFEN (ADVIL,MOTRIN) 800 MG TABLET    Take 1 tablet (800 mg total) by mouth every 8 (eight) hours as needed for moderate pain.   OXYCODONE-ACETAMINOPHEN (ROXICET) 5-325 MG TABLET    Take 1 tablet by mouth every 4 (four) hours as needed for severe pain.     Note:  This document was prepared using Dragon voice recognition software and may include unintentional dictation errors.    Irean Hong, MD 02/03/16 [phone]

## 2016-02-03 NOTE — ED Notes (Signed)
Pt states that his L foot hurts when he takes steps.  Pt states that this started yesterday.  Pt reports having prior foot issues with L foot.  He sees a foot specialist for an ingrown toenail on L foot and occasional gout flare ups.  Pt states this pain is different and only hurts when putting weight onto feet.

## 2016-02-03 NOTE — Discharge Instructions (Signed)
1. Take pain medicines as needed (Motrin/Percocet #15). 2. Apply ice over Ace wrap several times daily. You may remove Ace wrap to sleep and bathe. 3. Use crutches as needed for walking. 4. Return to the ER for worsening symptoms, persistent vomiting, difficulty breathing or other concerns.  Plantar Fasciitis Plantar fasciitis is a painful foot condition that affects the heel. It occurs when the band of tissue that connects the toes to the heel bone (plantar fascia) becomes irritated. This can happen after exercising too much or doing other repetitive activities (overuse injury). The pain from plantar fasciitis can range from mild irritation to severe pain that makes it difficult for you to walk or move. The pain is usually worse in the morning or after you have been sitting or lying down for a while. CAUSES This condition may be caused by:  Standing for long periods of time.  Wearing shoes that do not fit.  Doing high-impact activities, including running, aerobics, and ballet.  Being overweight.  Having an abnormal way of walking (gait).  Having tight calf muscles.  Having high arches in your feet.  Starting a new athletic activity. SYMPTOMS The main symptom of this condition is heel pain. Other symptoms include:  Pain that gets worse after activity or exercise.  Pain that is worse in the morning or after resting.  Pain that goes away after you walk for a few minutes. DIAGNOSIS This condition may be diagnosed based on your signs and symptoms. Your health care provider will also do a physical exam to check for:  A tender area on the bottom of your foot.  A high arch in your foot.  Pain when you move your foot.  Difficulty moving your foot. You may also need to have imaging studies to confirm the diagnosis. These can include:  X-rays.  Ultrasound.  MRI. TREATMENT  Treatment for plantar fasciitis depends on the severity of the condition. Your treatment may  include:  Rest, ice, and over-the-counter pain medicines to manage your pain.  Exercises to stretch your calves and your plantar fascia.  A splint that holds your foot in a stretched, upward position while you sleep (night splint).  Physical therapy to relieve symptoms and prevent problems in the future.  Cortisone injections to relieve severe pain.  Extracorporeal shock wave therapy (ESWT) to stimulate damaged plantar fascia with electrical impulses. It is often used as a last resort before surgery.  Surgery, if other treatments have not worked after 12 months. HOME CARE INSTRUCTIONS  Take medicines only as directed by your health care provider.  Avoid activities that cause pain.  Roll the bottom of your foot over a bag of ice or a bottle of cold water. Do this for 20 minutes, 3-4 times a day.  Perform simple stretches as directed by your health care provider.  Try wearing athletic shoes with air-sole or gel-sole cushions or soft shoe inserts.  Wear a night splint while sleeping, if directed by your health care provider.  Keep all follow-up appointments with your health care provider. PREVENTION   Do not perform exercises or activities that cause heel pain.  Consider finding low-impact activities if you continue to have problems.  Lose weight if you need to. The best way to prevent plantar fasciitis is to avoid the activities that aggravate your plantar fascia. SEEK MEDICAL CARE IF:  Your symptoms do not go away after treatment with home care measures.  Your pain gets worse.  Your pain affects your ability to  move or do your daily activities.   This information is not intended to replace advice given to you by your health care provider. Make sure you discuss any questions you have with your health care provider.   Document Released: 05/04/2001 Document Revised: 04/30/2015 Document Reviewed: 06/19/2014 Elsevier Interactive Patient Education 2016 Elsevier  Inc.  Cryotherapy Cryotherapy is when you put ice on your injury. Ice helps lessen pain and puffiness (swelling) after an injury. Ice works the best when you start using it in the first 24 to 48 hours after an injury. HOME CARE  Put a dry or damp towel between the ice pack and your skin.  You may press gently on the ice pack.  Leave the ice on for no more than 10 to 20 minutes at a time.  Check your skin after 5 minutes to make sure your skin is okay.  Rest at least 20 minutes between ice pack uses.  Stop using ice when your skin loses feeling (numbness).  Do not use ice on someone who cannot tell you when it hurts. This includes small children and people with memory problems (dementia). GET HELP RIGHT AWAY IF:  You have white spots on your skin.  Your skin turns blue or pale.  Your skin feels waxy or hard.  Your puffiness gets worse. MAKE SURE YOU:   Understand these instructions.  Will watch your condition.  Will get help right away if you are not doing well or get worse.   This information is not intended to replace advice given to you by your health care provider. Make sure you discuss any questions you have with your health care provider.   Document Released: 01/26/2008 Document Revised: 11/01/2011 Document Reviewed: 04/01/2011 Elsevier Interactive Patient Education [employer]

## 2016-09-20 ENCOUNTER — Ambulatory Visit: Payer: Self-pay

## 2016-09-20 ENCOUNTER — Other Ambulatory Visit: Payer: Self-pay | Admitting: Occupational Medicine

## 2016-09-20 DIAGNOSIS — M25532 Pain in left wrist: Secondary | ICD-10-CM

## 2016-10-18 ENCOUNTER — Ambulatory Visit: Payer: Self-pay

## 2016-10-18 ENCOUNTER — Other Ambulatory Visit: Payer: Self-pay | Admitting: Occupational Medicine

## 2016-10-18 DIAGNOSIS — M25522 Pain in left elbow: Secondary | ICD-10-CM

## 2016-10-22 ENCOUNTER — Encounter (INDEPENDENT_AMBULATORY_CARE_PROVIDER_SITE_OTHER): Payer: Self-pay

## 2016-10-22 ENCOUNTER — Encounter (INDEPENDENT_AMBULATORY_CARE_PROVIDER_SITE_OTHER): Payer: Self-pay | Admitting: Orthopaedic Surgery

## 2016-10-22 ENCOUNTER — Ambulatory Visit (INDEPENDENT_AMBULATORY_CARE_PROVIDER_SITE_OTHER): Payer: Worker's Compensation | Admitting: Orthopaedic Surgery

## 2016-10-22 VITALS — BP 119/80 | HR 72

## 2016-10-22 DIAGNOSIS — M25522 Pain in left elbow: Secondary | ICD-10-CM

## 2016-10-22 NOTE — Addendum Note (Signed)
Addended by: Albertina ParrGARCIA, Wajiha Versteeg on: 10/22/2016 10:27 AM   Modules accepted: Orders

## 2016-10-22 NOTE — Progress Notes (Signed)
Office Visit Note   Patient: Francisco Stuart           Date of Birth: 17-Jan-1981           MRN: [id number] Visit Date: 10/22/2016              Requested by: Jaclyn Shaggy, MD [address]   [place], [place] [address] PCP: Jaclyn Shaggy, MD   Assessment & Plan: Visit Diagnoses:  1. Pain in left elbow     Plan: Recommend MRI of the left elbow to rule out ulnar collateral ligament tear and flexor pronator insertional tear. Light duty for now. All of after the MRI  Follow-Up Instructions: Return in about 2 weeks (around 11/05/2016).   Orders:  No orders of the defined types were placed in this encounter.  No orders of the defined types were placed in this encounter.     Procedures: No procedures performed   Clinical Data: No additional findings.   Subjective: Chief Complaint  Patient presents with  . Left Elbow - Pain  . Left Wrist - Pain    Patient is a 36 year old gentleman who was injured on-the-job approximately 6 weeks ago when a heavy load of roofing fell onto his wrist and caused his wrist to hyperextend. He had immediate pain shot into his wrist and into his proximal forearm. He endorses pain currently and his proximal forearm better in the wrist. Denies any paresthesias.    Review of Systems  Constitutional: Negative.   All other systems reviewed and are negative.    Objective: Vital Signs: BP 119/80   Pulse 72   SpO2 97%   Physical Exam  Constitutional: He is oriented to person, place, and time. He appears well-developed and well-nourished.  HENT:  Head: Normocephalic and atraumatic.  Eyes: Pupils are equal, round, and reactive to light.  Neck: Neck supple.  Pulmonary/Chest: Effort normal.  Abdominal: Soft.  Musculoskeletal: Normal range of motion.  Neurological: He is alert and oriented to person, place, and time.  Skin: Skin is warm.  Psychiatric: He has a normal mood and affect. His behavior is normal. Judgment and thought content  normal.  Nursing note and vitals reviewed.   Ortho Exam Exam of the left upper extremity shows moderate tenderness palpation of the medial epicondyle. He has full motion of his elbow. He does have pain with resisted pronation and wrist flexion. His lateral epicondyle is mildly tender. Specialty Comments:  No specialty comments available.  Imaging: No results found.   PMFS History: Patient Active Problem List   Diagnosis Date Noted  . Pain in left elbow 10/22/2016  . Chest pain 02/10/2012  . Dyspnea 02/10/2012   Past Medical History:  Diagnosis Date  . Anxiety   . History of hemorrhoids   . Hypertension   . Hypothyroidism   . Obesity   . Panic attacks     Family History  Problem Relation Age of Onset  . Heart attack Maternal Uncle 55    MI s/p CABG  . Heart attack Maternal Grandfather 77    MI    Past Surgical History:  Procedure Laterality Date  . COLONOSCOPY  2011   Social History   Occupational History  . Not on file.   Social History Main Topics  . Smoking status: Former Smoker    Packs/day: 3.00    Years: 5.00    Types: Cigarettes  . Smokeless tobacco: Current User    Types: Chew  . Alcohol  use Yes     Comment: Drinks an 18 pack of beer on weekends.  . Drug use: No     Comment: smoked marijuana on occas. at a party.  . Sexual activity: Not on file

## 2016-11-05 ENCOUNTER — Telehealth (INDEPENDENT_AMBULATORY_CARE_PROVIDER_SITE_OTHER): Payer: Self-pay | Admitting: *Deleted

## 2016-11-05 ENCOUNTER — Encounter (INDEPENDENT_AMBULATORY_CARE_PROVIDER_SITE_OTHER): Payer: Self-pay | Admitting: Orthopaedic Surgery

## 2016-11-05 ENCOUNTER — Ambulatory Visit (INDEPENDENT_AMBULATORY_CARE_PROVIDER_SITE_OTHER): Payer: Worker's Compensation | Admitting: Orthopaedic Surgery

## 2016-11-05 DIAGNOSIS — M25522 Pain in left elbow: Secondary | ICD-10-CM | POA: Diagnosis not present

## 2016-11-05 MED ORDER — DICLOFENAC SODIUM 1 % TD GEL: 2.0000 g | Freq: Four times a day (QID) | TRANSDERMAL | 5 refills | Status: AC

## 2016-11-05 MED ORDER — DICLOFENAC SODIUM 75 MG PO TBEC: 75.0000 mg | DELAYED_RELEASE_TABLET | Freq: Two times a day (BID) | ORAL | 2 refills | Status: AC

## 2016-11-05 MED ORDER — PREDNISONE 10 MG (21) PO TBPK: ORAL_TABLET | ORAL | 0 refills | Status: AC

## 2016-11-05 NOTE — Progress Notes (Signed)
Office Visit Note   Patient: Francisco Stuart           Date of Birth: 1981/01/07           MRN: [id number] Visit Date: 11/05/2016              Requested by: Jaclyn Shaggy, MD [address]   [place], [place] [address] PCP: Jaclyn Shaggy, MD   Assessment & Plan: Visit Diagnoses:  1. Pain in left elbow     Plan: MRI reviewed shows small joint effusion without any on a collateral tears or acute findings. I recommend that he rests his elbow for a week from work. I think he is likely overuse it. I gave him another round of Sterapred diclofenac and topical Voltaren gel. I'll like to see him back around April 13 for recheck.  Follow-Up Instructions: Return in about 4 weeks (around 12/03/2016).   Orders:  No orders of the defined types were placed in this encounter.  Meds ordered this encounter  Medications  . diclofenac (VOLTAREN) 75 MG EC tablet    Sig: Take 1 tablet (75 mg total) by mouth 2 (two) times daily.    Dispense:  30 tablet    Refill:  2  . predniSONE (STERAPRED UNI-PAK 21 TAB) 10 MG (21) TBPK tablet    Sig: Take as directed    Dispense:  21 tablet    Refill:  0  . diclofenac sodium (VOLTAREN) 1 % GEL    Sig: Apply 2 g topically 4 (four) times daily.    Dispense:  1 Tube    Refill:  5      Procedures: No procedures performed   Clinical Data: No additional findings.   Subjective: Chief Complaint  Patient presents with  . Left Elbow - Follow-up    Patient follows up today to review his left elbow MRI. He states that he still has some discomfort when he is picking up his child. He denies any radiation of pain. Steroid taper did give him some relief. Rest has given him relief also.    Review of Systems  Constitutional: Negative.   All other systems reviewed and are negative.    Objective: Vital Signs: There were no vitals taken for this visit.  Physical Exam  Constitutional: He is oriented to person, place, and time. He appears  well-developed and well-nourished.  Pulmonary/Chest: Effort normal.  Abdominal: Soft.  Neurological: He is alert and oriented to person, place, and time.  Skin: Skin is warm.  Psychiatric: He has a normal mood and affect. His behavior is normal. Judgment and thought content normal.  Nursing note and vitals reviewed.   Ortho Exam Exam of the left elbow is stable. Specialty Comments:  No specialty comments available.  Imaging: No results found.   PMFS History: Patient Active Problem List   Diagnosis Date Noted  . Pain in left elbow 10/22/2016  . Chest pain 02/10/2012  . Dyspnea 02/10/2012   Past Medical History:  Diagnosis Date  . Anxiety   . History of hemorrhoids   . Hypertension   . Hypothyroidism   . Obesity   . Panic attacks     Family History  Problem Relation Age of Onset  . Heart attack Maternal Uncle 55    MI s/p CABG  . Heart attack Maternal Grandfather 72    MI    Past Surgical History:  Procedure Laterality Date  . COLONOSCOPY  2011   Social History  Occupational History  . Not on file.   Social History Main Topics  . Smoking status: Former Smoker    Packs/day: 3.00    Years: 5.00    Types: Cigarettes  . Smokeless tobacco: Current User    Types: Chew  . Alcohol use Yes     Comment: Drinks an 18 pack of beer on weekends.  . Drug use: No     Comment: smoked marijuana on occas. at a party.  . Sexual activity: Not on file

## 2016-11-05 NOTE — Telephone Encounter (Signed)
Pt calling about out of work status, requesting a call back [phone]

## 2016-11-09 NOTE — Telephone Encounter (Signed)
[facility] pt [facility] that [facility] to be out of work  For 1 week but will pay him if he is out for 4-6+ weeks.  I advised him to speak to case manager or someone from [facility] to see what needs to be done. He will call us back.

## 2016-11-11 ENCOUNTER — Telehealth (INDEPENDENT_AMBULATORY_CARE_PROVIDER_SITE_OTHER): Payer: Self-pay | Admitting: Orthopaedic Surgery

## 2016-11-11 NOTE — Telephone Encounter (Signed)
Patient called and wants to know if Dr. Roda ShuttersXU will keep him out of work longer because his arm is not getting any better. He states that he is suppose to be going back to work tomorrow. CB#[phone].

## 2016-11-11 NOTE — Telephone Encounter (Signed)
Please advise 

## 2016-11-11 NOTE — Telephone Encounter (Signed)
He stated he wanted to go back to light duty last time we spoke I believe

## 2016-11-12 ENCOUNTER — Telehealth (INDEPENDENT_AMBULATORY_CARE_PROVIDER_SITE_OTHER): Payer: Self-pay | Admitting: Orthopaedic Surgery

## 2016-11-12 NOTE — Telephone Encounter (Signed)
Patient called advised he went to his (PT) this morning and was told his arm is swollen more than before. Patient said the (PT) advised him to take more time off work.  Patient said she advised that he return to work after his next office visit. (April 13th). The number to fax the note is to his employer is fax# is [phone]  Patient asked if he can get a call back today to discuss. The number to contact patient is [phone]

## 2016-11-15 ENCOUNTER — Telehealth (INDEPENDENT_AMBULATORY_CARE_PROVIDER_SITE_OTHER): Payer: Self-pay | Admitting: Orthopaedic Surgery

## 2016-11-15 NOTE — Telephone Encounter (Signed)
Patient called asked if he can be called before the note is faxed to his employer. Patient said he maybe able to pick up the note himself. Patient said he would like a copy of the note as well. The number to contact patient is [phone]

## 2016-11-15 NOTE — Telephone Encounter (Signed)
Please advise. See message below.  

## 2016-11-15 NOTE — Telephone Encounter (Signed)
Called pt to advise on message but he states he has appt sched for tomorrow.

## 2016-11-15 NOTE — Telephone Encounter (Signed)
That's fine

## 2016-11-15 NOTE — Telephone Encounter (Signed)
Patient has appointment 11/16/16 @ 9:00am

## 2016-11-16 ENCOUNTER — Ambulatory Visit (INDEPENDENT_AMBULATORY_CARE_PROVIDER_SITE_OTHER): Payer: Worker's Compensation | Admitting: Orthopaedic Surgery

## 2016-11-16 ENCOUNTER — Encounter (INDEPENDENT_AMBULATORY_CARE_PROVIDER_SITE_OTHER): Payer: Self-pay | Admitting: Orthopaedic Surgery

## 2016-11-16 DIAGNOSIS — M25522 Pain in left elbow: Secondary | ICD-10-CM | POA: Diagnosis not present

## 2016-11-16 NOTE — Progress Notes (Signed)
Office Visit Note   Patient: Francisco Stuart           Date of Birth: 1980-12-23           MRN: [id number] Visit Date: 11/16/2016              Requested by: Jaclyn Shaggy, MD [address]   [place], [place] [address] PCP: Jaclyn Shaggy, MD   Assessment & Plan: Visit Diagnoses:  1. Pain in left elbow     Plan: I recommend that he be out of work completely for 4 weeks. Elbow compression sleeve was ordered. I did tell him he is to take the steroid taper. His MRI findings were essentially negative with a small joint effusion. Overall impression is that he has overuse of his elbow and he needs to completely rest it.  I will see him back around April 20.  Follow-Up Instructions: Return in about 3 weeks (around 12/10/2016).   Orders:  No orders of the defined types were placed in this encounter.  No orders of the defined types were placed in this encounter.     Procedures: No procedures performed   Clinical Data: No additional findings.   Subjective: Chief Complaint  Patient presents with  . Left Elbow - Edema, Pain    Francisco Stuart comes back today for follow-up of his left elbow pain. He states that he has been having worsening swelling and pain with use of the elbow. He is essentially been asked to do full duty at work despite my restrictions. He did not take the steroid taper. The Voltaren gel is pending worker's comp approval.    Review of Systems  Constitutional: Negative.   All other systems reviewed and are negative.    Objective: Vital Signs: There were no vitals taken for this visit.  Physical Exam  Constitutional: He is oriented to person, place, and time. He appears well-developed and well-nourished.  Pulmonary/Chest: Effort normal.  Abdominal: Soft.  Neurological: He is alert and oriented to person, place, and time.  Skin: Skin is warm.  Psychiatric: He has a normal mood and affect. His behavior is normal. Judgment and thought content normal.    Nursing note and vitals reviewed.   Ortho Exam Left elbow exam shows no significant swelling or lesions or rashes. He does have a deep-seated pain within the elbow with movement elbow joint. Pain is moderate. Specialty Comments:  No specialty comments available.  Imaging: No results found.   PMFS History: Patient Active Problem List   Diagnosis Date Noted  . Pain in left elbow 10/22/2016  . Chest pain 02/10/2012  . Dyspnea 02/10/2012   Past Medical History:  Diagnosis Date  . Anxiety   . History of hemorrhoids   . Hypertension   . Hypothyroidism   . Obesity   . Panic attacks     Family History  Problem Relation Age of Onset  . Heart attack Maternal Uncle 55    MI s/p CABG  . Heart attack Maternal Grandfather 37    MI    Past Surgical History:  Procedure Laterality Date  . COLONOSCOPY  2011   Social History   Occupational History  . Not on file.   Social History Main Topics  . Smoking status: Former Smoker    Packs/day: 3.00    Years: 5.00    Types: Cigarettes  . Smokeless tobacco: Current User    Types: Chew  . Alcohol use Yes     Comment:  Drinks an 18 pack of beer on weekends.  . Drug use: No     Comment: smoked marijuana on occas. at a party.  . Sexual activity: Not on file

## 2016-11-17 ENCOUNTER — Telehealth (INDEPENDENT_AMBULATORY_CARE_PROVIDER_SITE_OTHER): Payer: Self-pay | Admitting: Orthopaedic Surgery

## 2016-11-17 NOTE — Telephone Encounter (Signed)
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  Please advise

## 2016-11-17 NOTE — Telephone Encounter (Signed)
Francisco DeerChristopher stated that even though he was on light duty, he wasn't truly doing light duty and was pressured to do regular duty by his employer.  Therefore he asked me to write him out of work entirely.

## 2016-11-17 NOTE — Telephone Encounter (Signed)
Please advise. See message below.  

## 2016-11-17 NOTE — Telephone Encounter (Signed)
Patient's employer Josie DixonBob Welch w/ [employer] called, wants to know why pt is oow 3 weeks and not working light duty.  Callback [phone]

## 2016-11-17 NOTE — Telephone Encounter (Signed)
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

## 2016-11-17 NOTE — Telephone Encounter (Signed)
Called Francisco Stuart back to advise on message below. No answer. If he calls please read to him the message below. Thank you. LMOM

## 2016-11-23 ENCOUNTER — Telehealth (INDEPENDENT_AMBULATORY_CARE_PROVIDER_SITE_OTHER): Payer: Self-pay | Admitting: Orthopaedic Surgery

## 2016-11-23 NOTE — Telephone Encounter (Signed)
Pt WC case manager requesting visit note for 3/27  Faxed to [phone]  Isabel Caprice

## 2016-11-23 NOTE — Telephone Encounter (Signed)
Francisco Stuart to advise per Dr Roda Shutters

## 2016-11-23 NOTE — Telephone Encounter (Signed)
Francisco Stuart, PTS EMPLOYER CALLED (LEFT MSG ON MY VM) PLEASE CALL BACK / CELL PHONE # [phone]

## 2016-11-23 NOTE — Telephone Encounter (Signed)
faxed

## 2016-11-24 ENCOUNTER — Encounter (INDEPENDENT_AMBULATORY_CARE_PROVIDER_SITE_OTHER): Payer: Self-pay

## 2016-11-24 ENCOUNTER — Other Ambulatory Visit (INDEPENDENT_AMBULATORY_CARE_PROVIDER_SITE_OTHER): Payer: Self-pay

## 2016-11-24 NOTE — Telephone Encounter (Signed)
Francisco Stuart left a voicemail in [employer] which was forwarded to my voicemail. He called wanting a call back from [place]. I called him and he needed something on paper stating what patient needed to be doing. I typed up paper per what Dr Roda Shutters had said and faxed letter to Employer Francisco Stuart to fax number [phone]  Callback #'s Phone # [phone] Cell # [phone] Fax # [phone]

## 2016-11-24 NOTE — Telephone Encounter (Signed)
Called pt to let him know Elbow sleeve (XL)  has arrived and is ready for pick up. We will try it on him before giving it to him to make sure it fits. He said he will be ocmming tomorrow. I told him that I would be at lunch from 11:30-12:30. Also we discussed his work status and also had questions and concerns regarding that. Nadine Counts Employer has been calling to check the work status for patient.  Per patient he would like for me to fax letter that was made today to [facility] lady Olegario Messier). Faxed to [phone]. Read letter to him before.

## 2016-11-29 ENCOUNTER — Telehealth (INDEPENDENT_AMBULATORY_CARE_PROVIDER_SITE_OTHER): Payer: Self-pay | Admitting: *Deleted

## 2016-11-29 NOTE — Telephone Encounter (Signed)
Pt called concerned about the note that was sent to his case worker. CB:[phone]

## 2016-11-30 NOTE — Telephone Encounter (Signed)
I spoke with wife Francisco Stuart and she says the case manager has called and told patient he needs to return to work, that there is paperwork to be done. I advised Francisco Stuart we have not sent another note to them, that the last note we have was as stated last week.  I have refaxed it to [place] with [facility] and asked her to call us to discuss this.  Francisco Stuart or I will follow up with her.

## 2016-11-30 NOTE — Telephone Encounter (Signed)
Called pt no answer. If he calls back please advise on message below.lmom

## 2016-11-30 NOTE — Telephone Encounter (Signed)
I spoke with Francisco Stuart and she will fax a letter to the [phone] fax.  She will clarify from the employer what light duty tasks are available for him to do.  Once patient is seen Friday, we can advise per that letter what pt can/cannot do.  Will you please call the patient and advise?  Dr Roda Shutters can discuss this list of duties with patient and they can go from there Friday, then you can fax it back to [place].  Thanks.

## 2016-12-03 ENCOUNTER — Ambulatory Visit (INDEPENDENT_AMBULATORY_CARE_PROVIDER_SITE_OTHER): Payer: Worker's Compensation | Admitting: Orthopaedic Surgery

## 2016-12-03 ENCOUNTER — Encounter (INDEPENDENT_AMBULATORY_CARE_PROVIDER_SITE_OTHER): Payer: Self-pay | Admitting: Orthopaedic Surgery

## 2016-12-03 ENCOUNTER — Telehealth (INDEPENDENT_AMBULATORY_CARE_PROVIDER_SITE_OTHER): Payer: Self-pay | Admitting: Radiology

## 2016-12-03 ENCOUNTER — Ambulatory Visit (INDEPENDENT_AMBULATORY_CARE_PROVIDER_SITE_OTHER): Payer: Self-pay

## 2016-12-03 ENCOUNTER — Ambulatory Visit (INDEPENDENT_AMBULATORY_CARE_PROVIDER_SITE_OTHER): Payer: Self-pay | Admitting: Orthopaedic Surgery

## 2016-12-03 ENCOUNTER — Telehealth (INDEPENDENT_AMBULATORY_CARE_PROVIDER_SITE_OTHER): Payer: Self-pay

## 2016-12-03 DIAGNOSIS — M25522 Pain in left elbow: Secondary | ICD-10-CM | POA: Diagnosis not present

## 2016-12-03 NOTE — Telephone Encounter (Signed)
FAXED TODAYS OFFICE VISIT NOTE TO WC Francisco Stuart NUMBER [phone]

## 2016-12-03 NOTE — Progress Notes (Addendum)
Office Visit Note   Patient: Francisco Stuart           Date of Birth: 04-24-1981           MRN: [id number] Visit Date: 12/03/2016              Requested by: Jaclyn Shaggy, MD [address]   [place], [place] [address] PCP: Jaclyn Shaggy, MD   Assessment & Plan: Visit Diagnoses:  1. Pain in left elbow     Plan: At this point recommend cervical spine MRI to rule out any central cause for his pain. I'm really having trouble pinpointing what exactly is going on. The pain seems to be out of proportion. We did perform a left elbow joint injection which she did not get much help from during the anesthetic phase. Completely out of work for now and definitely. Follow-up after the MRI. Total face to face encounter time was greater than 25 minutes and over half of this time was spent in counseling and/or coordination of care.   Follow-Up Instructions: Return if symptoms worsen or fail to improve.   Orders:  Orders Placed This Encounter  Procedures  . Medium Joint Injection/Arthrocentesis  . XR Cervical Spine 2 or 3 views  . MR Cervical Spine w/o contrast   No orders of the defined types were placed in this encounter.     Procedures: Medium Joint Inj Date/Time: 12/03/2016 9:12 AM Performed by: Tarry Kos Authorized by: Tarry Kos   Location:  Elbow Site:  L elbow Needle Size:  25 G Ultrasound Guided: No   Fluoroscopic Guidance: No       Clinical Data: No additional findings.   Subjective: Chief Complaint  Patient presents with  . Left Elbow - Follow-up    Patient follows up today for continued left elbow pain. He is endorse and severe pain throughout the elbow is worse with any sort of use. He is not able to perform any particular his job weather is regular duty or light duty. He has severe pain throughout his left upper extremity. He denies any neck pain but does endorse radiation of pain up into the shoulder and into the neck and down into the  wrist.    Review of Systems  Constitutional: Negative.   All other systems reviewed and are negative.    Objective: Vital Signs: There were no vitals taken for this visit.  Physical Exam  Constitutional: He is oriented to person, place, and time. He appears well-developed and well-nourished.  Pulmonary/Chest: Effort normal.  Abdominal: Soft.  Neurological: He is alert and oriented to person, place, and time.  Skin: Skin is warm.  Psychiatric: He has a normal mood and affect. His behavior is normal. Judgment and thought content normal.  Nursing note and vitals reviewed.   Ortho Exam Left elbow exam shows severe but significant guarding with the exam. He is hesitant to move any part of his left upper extremity. Pain seems to be out of proportion with his complaints. His wrist is nontender shoulder is nontender as neck is nontender. He has significant guarding with any attempted motion. Specialty Comments:  No specialty comments available.  Imaging: No significant or acute abnormalities. Slight loss of lordotic curve.   PMFS History: Patient Active Problem List   Diagnosis Date Noted  . Pain in left elbow 10/22/2016  . Chest pain 02/10/2012  . Dyspnea 02/10/2012   Past Medical History:  Diagnosis Date  . Anxiety   .  History of hemorrhoids   . Hypertension   . Hypothyroidism   . Obesity   . Panic attacks     Family History  Problem Relation Age of Onset  . Heart attack Maternal Uncle 55       MI s/p CABG  . Heart attack Maternal Grandfather 65       MI    Past Surgical History:  Procedure Laterality Date  . COLONOSCOPY  2011   Social History   Occupational History  . Not on file.   Social History Main Topics  . Smoking status: Former Smoker    Packs/day: 3.00    Years: 5.00    Types: Cigarettes  . Smokeless tobacco: Current User    Types: Chew  . Alcohol use Yes     Comment: Drinks an 18 pack of beer on weekends.  . Drug use: No     Comment:  smoked marijuana on occas. at a party.  . Sexual activity: Not on file

## 2016-12-06 ENCOUNTER — Telehealth (INDEPENDENT_AMBULATORY_CARE_PROVIDER_SITE_OTHER): Payer: Self-pay | Admitting: *Deleted

## 2016-12-06 NOTE — Telephone Encounter (Signed)
WC requesting last office note faxed to [phone]

## 2016-12-06 NOTE — Telephone Encounter (Signed)
Faxed office and work note from 12/03/16

## 2016-12-09 ENCOUNTER — Ambulatory Visit (INDEPENDENT_AMBULATORY_CARE_PROVIDER_SITE_OTHER): Payer: Self-pay | Admitting: Orthopaedic Surgery

## 2016-12-17 ENCOUNTER — Encounter (INDEPENDENT_AMBULATORY_CARE_PROVIDER_SITE_OTHER): Payer: Self-pay

## 2016-12-17 ENCOUNTER — Ambulatory Visit (INDEPENDENT_AMBULATORY_CARE_PROVIDER_SITE_OTHER): Payer: Self-pay | Admitting: Orthopaedic Surgery

## 2016-12-24 ENCOUNTER — Ambulatory Visit (INDEPENDENT_AMBULATORY_CARE_PROVIDER_SITE_OTHER): Payer: Worker's Compensation | Admitting: Orthopaedic Surgery

## 2016-12-24 ENCOUNTER — Ambulatory Visit (INDEPENDENT_AMBULATORY_CARE_PROVIDER_SITE_OTHER): Payer: Self-pay | Admitting: Orthopaedic Surgery

## 2016-12-24 ENCOUNTER — Telehealth (INDEPENDENT_AMBULATORY_CARE_PROVIDER_SITE_OTHER): Payer: Self-pay | Admitting: Orthopaedic Surgery

## 2016-12-24 DIAGNOSIS — M25522 Pain in left elbow: Secondary | ICD-10-CM

## 2016-12-24 MED ORDER — TRAMADOL HCL 50 MG PO TABS: 50.0000 mg | ORAL_TABLET | Freq: Two times a day (BID) | ORAL | 0 refills | Status: AC | PRN

## 2016-12-24 NOTE — Telephone Encounter (Signed)
Error

## 2016-12-24 NOTE — Progress Notes (Signed)
Office Visit Note   Patient: Francisco Stuart L Kem           Date of Birth: 07/17/1981           MRN: [id number] Visit Date: 12/24/2016              Requested by: Jaclyn Shaggyenny C Tate, MD [address]   [place], [place] [id number] PCP: Jaclyn ShaggyATE,DENNY C, MD   Assessment & Plan: Visit Diagnoses:  1. Pain in left elbow     Plan: Overall having trouble identifying an organic reason why he is still having this much pain. I did give him a prescription for tramadol. We are waiting on Worker's Comp. approval for a cervical spine MRI. I will like to also get a nerve conduction study to rule out cubital tunnel syndrome. We could consider a functional capacity evaluation if we failed to identify any sort of pathology. For now he states that he still cannot return to work given his symptoms. We'll therefore go ahead and keep him out of work indefinitely. He is currently working with a worker's comp attorney to help facilitate his case. The injection I gave him last time did not help.  Follow-Up Instructions: Return if symptoms worsen or fail to improve.   Orders:  Orders Placed This Encounter  Procedures  . Ambulatory referral to Physical Medicine Rehab   Meds ordered this encounter  Medications  . traMADol (ULTRAM) 50 MG tablet    Sig: Take 1 tablet (50 mg total) by mouth every 12 (twelve) hours as needed.    Dispense:  30 tablet    Refill:  0      Procedures: No procedures performed   Clinical Data: No additional findings.   Subjective: No chief complaint on file.   Francisco Stuart returns today for follow-up of his left elbow pain. He is endorse seeing focal swelling on the lateral aspect of his elbow. He's been working with physical therapy and there was concern about this area. He denies a constitutional symptoms. He states that he occasionally has some numbness in the hand. He also endorses some occasional numbness and pain in his medial sided elbow.    Review of Systems    Constitutional: Negative.   All other systems reviewed and are negative.    Objective: Vital Signs: There were no vitals taken for this visit.  Physical Exam  Constitutional: He is oriented to person, place, and time. He appears well-developed and well-nourished.  Pulmonary/Chest: Effort normal.  Abdominal: Soft.  Neurological: He is alert and oriented to person, place, and time.  Skin: Skin is warm.  Psychiatric: He has a normal mood and affect. His behavior is normal. Judgment and thought content normal.  Nursing note and vitals reviewed.   Ortho Exam Left upper extremity exam shows a stable ulnar nerve. He does not have any ulnar symptoms. Negative Tinel at the cubital tunnel. Overall range of motion is normal. He has vague discomfort with palpation throughout his elbow and wrist and hand. The focal area of swelling that he endorses is not really anything significant compared to his contralateral elbow. Specialty Comments:  No specialty comments available.  Imaging: No results found.   PMFS History: Patient Active Problem List   Diagnosis Date Noted  . Pain in left elbow 10/22/2016  . Chest pain 02/10/2012  . Dyspnea 02/10/2012   Past Medical History:  Diagnosis Date  . Anxiety   . History of hemorrhoids   . Hypertension   .  Hypothyroidism   . Obesity   . Panic attacks     Family History  Problem Relation Age of Onset  . Heart attack Maternal Uncle 55    MI s/p CABG  . Heart attack Maternal Grandfather 22    MI    Past Surgical History:  Procedure Laterality Date  . COLONOSCOPY  2011   Social History   Occupational History  . Not on file.   Social History Main Topics  . Smoking status: Former Smoker    Packs/day: 3.00    Years: 5.00    Types: Cigarettes  . Smokeless tobacco: Current User    Types: Chew  . Alcohol use Yes     Comment: Drinks an 18 pack of beer on weekends.  . Drug use: No     Comment: smoked marijuana on occas. at a party.  .  Sexual activity: Not on file

## 2016-12-27 ENCOUNTER — Telehealth (INDEPENDENT_AMBULATORY_CARE_PROVIDER_SITE_OTHER): Payer: Self-pay

## 2016-12-27 ENCOUNTER — Telehealth (INDEPENDENT_AMBULATORY_CARE_PROVIDER_SITE_OTHER): Payer: Self-pay | Admitting: Orthopaedic Surgery

## 2016-12-27 NOTE — Telephone Encounter (Signed)
Faxed the 12/24/16 office and work note and emg/ncv order to case mgr per her request

## 2016-12-27 NOTE — Telephone Encounter (Signed)
PT WC COVENTRY REQUESTING PT WORK STATUS   CALL [phone] [facility]

## 2016-12-28 NOTE — Telephone Encounter (Signed)
Called her back to advise  

## 2017-04-08 NOTE — Telephone Encounter (Signed)
error 

## 2018-04-18 ENCOUNTER — Other Ambulatory Visit: Payer: Self-pay

## 2018-04-18 ENCOUNTER — Encounter: Payer: Self-pay | Admitting: *Deleted

## 2018-04-18 ENCOUNTER — Emergency Department
Admission: EM | Admit: 2018-04-18 | Discharge: 2018-04-19 | Disposition: A | Discharge status: Home / Self Care | Payer: Medicaid Other | Attending: Emergency Medicine | Admitting: Emergency Medicine

## 2018-04-18 DIAGNOSIS — I10 Essential (primary) hypertension: Secondary | ICD-10-CM | POA: Insufficient documentation

## 2018-04-18 DIAGNOSIS — Z79899 Other long term (current) drug therapy: Secondary | ICD-10-CM | POA: Insufficient documentation

## 2018-04-18 DIAGNOSIS — Y999 Unspecified external cause status: Secondary | ICD-10-CM | POA: Insufficient documentation

## 2018-04-18 DIAGNOSIS — E039 Hypothyroidism, unspecified: Secondary | ICD-10-CM | POA: Diagnosis not present

## 2018-04-18 DIAGNOSIS — Y92000 Kitchen of unspecified non-institutional (private) residence as  the place of occurrence of the external cause: Secondary | ICD-10-CM | POA: Insufficient documentation

## 2018-04-18 DIAGNOSIS — Y939 Activity, unspecified: Secondary | ICD-10-CM | POA: Insufficient documentation

## 2018-04-18 DIAGNOSIS — W010XXA Fall on same level from slipping, tripping and stumbling without subsequent striking against object, initial encounter: Secondary | ICD-10-CM | POA: Diagnosis not present

## 2018-04-18 DIAGNOSIS — M5442 Lumbago with sciatica, left side: Secondary | ICD-10-CM | POA: Diagnosis not present

## 2018-04-18 DIAGNOSIS — Z87891 Personal history of nicotine dependence: Secondary | ICD-10-CM | POA: Insufficient documentation

## 2018-04-18 DIAGNOSIS — Z7982 Long term (current) use of aspirin: Secondary | ICD-10-CM | POA: Insufficient documentation

## 2018-04-18 DIAGNOSIS — S39012A Strain of muscle, fascia and tendon of lower back, initial encounter: Secondary | ICD-10-CM | POA: Diagnosis not present

## 2018-04-18 DIAGNOSIS — S3992XA Unspecified injury of lower back, initial encounter: Secondary | ICD-10-CM | POA: Diagnosis present

## 2018-04-18 NOTE — ED Triage Notes (Signed)
Pt has low back pain.  Pt fell inside the house tonight.  No loc.  Pt alert.

## 2018-04-19 ENCOUNTER — Emergency Department: Payer: Medicaid Other

## 2018-04-19 MED ORDER — LIDOCAINE 5 % EX PTCH: 1.0000 | MEDICATED_PATCH | Freq: Two times a day (BID) | CUTANEOUS | 0 refills | Status: AC

## 2018-04-19 MED ORDER — PREDNISONE 10 MG (21) PO TBPK: ORAL_TABLET | ORAL | 0 refills | Status: AC

## 2018-04-19 MED ORDER — KETOROLAC TROMETHAMINE 60 MG/2ML IM SOLN
60.0000 mg | Freq: Once | INTRAMUSCULAR | Status: AC
  Administered 2018-04-19: 60 mg via INTRAMUSCULAR
  Filled 2018-04-19: qty 2

## 2018-04-19 MED ORDER — OXYCODONE-ACETAMINOPHEN 5-325 MG PO TABS
2.0000 | ORAL_TABLET | Freq: Once | ORAL | Status: AC
  Administered 2018-04-19: 2 via ORAL
  Filled 2018-04-19: qty 2

## 2018-04-19 MED ORDER — CYCLOBENZAPRINE HCL 10 MG PO TABS
10.0000 mg | ORAL_TABLET | Freq: Once | ORAL | Status: AC
  Administered 2018-04-19: 10 mg via ORAL
  Filled 2018-04-19: qty 1

## 2018-04-19 MED ORDER — LIDOCAINE 5 % EX PTCH
1.0000 | MEDICATED_PATCH | CUTANEOUS | Status: DC
  Administered 2018-04-19: 1 via TRANSDERMAL
  Filled 2018-04-19: qty 1

## 2018-04-19 MED ORDER — CYCLOBENZAPRINE HCL 10 MG PO TABS: 10.0000 mg | ORAL_TABLET | Freq: Three times a day (TID) | ORAL | 0 refills | Status: AC | PRN

## 2018-04-19 NOTE — ED Provider Notes (Signed)
[facility] Emergency Department Provider Note   ____________________________________________   First MD Initiated Contact with Patient 04/18/18 2344     (approximate)  I have reviewed the triage vital signs and the nursing notes.   HISTORY  Chief Complaint Back Pain    HPI Francisco Stuart is a 37 y.o. male who comes into the hospital today after having a fall at home.  The patient states that he slipped going into the kitchen.  He fell onto his back.  The patient states that he hit his lower back and he cannot stand the pain.  This occurred around 4:30 PM.  He did not take any medicine at home but is having pain down his left leg.  The patient feels like his legs may be weak because when he stands they do not feel very sturdy and strong.  He denies any problems with urination or bowel movements.  He feels some numbness across his low back but rates pain a 7-8 out of 10 in intensity.  He is here today for evaluation.   Past Medical History:  Diagnosis Date  . Anxiety   . History of hemorrhoids   . Hypertension   . Hypothyroidism   . Obesity   . Panic attacks     Patient Active Problem List   Diagnosis Date Noted  . Pain in left elbow 10/22/2016  . Chest pain 02/10/2012  . Dyspnea 02/10/2012    Past Surgical History:  Procedure Laterality Date  . COLONOSCOPY  2011    Prior to Admission medications   Medication Sig Start Date End Date Taking? Authorizing Provider  aspirin 81 MG tablet Take 81 mg by mouth daily.    [provider]  clonazePAM (KLONOPIN) 1 MG tablet Take 1 mg by mouth at bedtime as needed.    [provider]  cyclobenzaprine (FLEXERIL) 10 MG tablet Take 1 tablet (10 mg total) by mouth 3 (three) times daily as needed for muscle spasms. 04/19/18   Rebecka Apley, MD  diclofenac (VOLTAREN) 75 MG EC tablet Take 1 tablet (75 mg total) by mouth 2 (two) times daily. 11/05/16   Tarry Kos, MD  diclofenac  sodium (VOLTAREN) 1 % GEL Apply 2 g topically 4 (four) times daily. 11/05/16   Tarry Kos, MD  ibuprofen (ADVIL,MOTRIN) 800 MG tablet Take 1 tablet (800 mg total) by mouth every 8 (eight) hours as needed for moderate pain. 02/03/16   Irean Hong, MD  levothyroxine (SYNTHROID, LEVOTHROID) 50 MCG tablet Take 1/2 tablet daily.    [provider]  lidocaine (LIDODERM) 5 % Place 1 patch onto the skin every 12 (twelve) hours. Remove & Discard patch within 12 hours or as directed by MD 04/19/18 04/19/19  Rebecka Apley, MD  metoprolol tartrate (LOPRESSOR) 25 MG tablet Take 25 mg by mouth 2 (two) times daily.    [provider]  predniSONE (STERAPRED UNI-PAK 21 TAB) 10 MG (21) TBPK tablet Take as directed 11/05/16   Tarry Kos, MD  predniSONE (STERAPRED UNI-PAK 21 TAB) 10 MG (21) TBPK tablet Take 6 tabs on day 1 Take 5 tabs on day 2 Take 4 tabs on day 3 Take 3 tabs on day 4 Take 2 tabs on day 5 Take 1 tab on day 6 04/19/18   Rebecka Apley, MD  traMADol (ULTRAM) 50 MG tablet Take 1 tablet (50 mg total) by mouth every 12 (twelve) hours as needed. 12/24/16   Roda Shutters,  Edwin CapNaiping M, MD    Allergies Patient has no known allergies.  Family History  Problem Relation Age of Onset  . Heart attack Maternal Uncle 55       MI s/p CABG  . Heart attack Maternal Grandfather 2062       MI    Social History Social History   Tobacco Use  . Smoking status: Former Smoker    Packs/day: 3.00    Years: 5.00    Pack years: 15.00    Types: Cigarettes  . Smokeless tobacco: Current User    Types: Chew  Substance Use Topics  . Alcohol use: Yes    Comment: Drinks an 18 pack of beer on weekends.  . Drug use: No    Types: Marijuana    Comment: smoked marijuana on occas. at a party.    Review of Systems  Constitutional: No fever/chills Eyes: No visual changes. ENT: No sore throat. Cardiovascular: Denies chest pain. Respiratory: Denies shortness of breath. Gastrointestinal: No abdominal  pain.  No nausea, no vomiting.  No diarrhea.  No constipation. Genitourinary: Negative for dysuria. Musculoskeletal: back pain. Skin: Negative for rash. Neurological: Negative for headaches, focal weakness or numbness.   ____________________________________________   PHYSICAL EXAM:  VITAL SIGNS: ED Triage Vitals  Enc Vitals Group     BP 04/18/18 2325 (!) 164/93     Pulse Rate 04/18/18 2323 78     Resp 04/18/18 2323 (!) 22     Temp 04/18/18 2323 98.8 F (37.1 C)     Temp Source 04/18/18 2323 Oral     SpO2 04/18/18 2323 99 %     Weight 04/18/18 2324 (!) 320 lb (145.2 kg)     Height 04/18/18 2324 6' (1.829 m)     Head Circumference --      Peak Flow --      Pain Score 04/18/18 2324 7     Pain Loc --      Pain Edu? --      Excl. in GC? --     Constitutional: Alert and oriented. Well appearing and in moderate distress. Eyes: Conjunctivae are normal. PERRL. EOMI. Head: Atraumatic. Nose: No congestion/rhinnorhea. Mouth/Throat: Mucous membranes are moist.  Oropharynx non-erythematous. Cardiovascular: Normal rate, regular rhythm. Grossly normal heart sounds.  Good peripheral circulation. Respiratory: Normal respiratory effort.  No retractions. Lungs CTAB. Gastrointestinal: Soft and nontender. No distention.  Positive bowel sounds Musculoskeletal: Tenderness palpation of lumbar spine as well as left SI joint.  Patient has positive straight leg raise but strength is 5 out of 5.  No numbness, no saddle anesthesia. Neurologic:  Normal speech and language.  Skin:  Skin is warm, dry and intact.  Psychiatric: Mood and affect are normal.   ____________________________________________   LABS (all labs ordered are listed, but only abnormal results are displayed)  Labs Reviewed - No data to display ____________________________________________  EKG  none ____________________________________________  RADIOLOGY  ED MD interpretation:  Lumbar spine: No acute fracture, Stable mild  lumbar spine levocurvature. Mild stable loss of intervertebral disc space height at the L4-5 and L5-S1  Official radiology report(s): Dg Lumbar Spine 2-3 Views  Result Date: 04/19/2018 CLINICAL DATA:  37 y/o  M; lower back pain. EXAM: LUMBAR SPINE - 2-3 VIEW COMPARISON:  08/19/2005 lumbar spine radiographs. FINDINGS: Five lumbar type non-rib-bearing vertebral bodies. Mild lumbar levocurvature with apex at L3 is stable. Straightening of lumbar lordosis without listhesis. Stable mild loss of intervertebral disc space height at the L4-5 and L5-S1 levels.  No loss of vertebral body height. No acute fracture. IMPRESSION: 1. No acute fracture. 2. Stable mild lumbar spine levocurvature. 3. Mild stable loss of intervertebral disc space height at the L4-5 and L5-S1 levels. Electronically Signed   By: Mitzi Hansen M.D.   On: 04/19/2018 00:28    ____________________________________________   PROCEDURES  Procedure(s) performed: None  Procedures  Critical Care performed: No  ____________________________________________   INITIAL IMPRESSION / ASSESSMENT AND PLAN / ED COURSE  As part of my medical decision making, I reviewed the following data within the electronic MEDICAL RECORD NUMBER Notes from prior ED visits and [place] Controlled Substance Database   This is a 37 year old male who comes into the hospital today with some back pain after a fall.  The patient states that it hurts when he lifts his leg on the left and he feels as though his legs are somewhat weak. The patient's strength is intact. We did an xray which is unremakable. He did receive some flexeril, percocet toradol and a lidoderm patch.  The patient still had some pain but as his x-rays are unremarkable and he is able to move around he will be discharged and encouraged to follow-up with his primary care physician.  The patient should return with any worsening symptoms.      ____________________________________________   FINAL  CLINICAL IMPRESSION(S) / ED DIAGNOSES  Final diagnoses:  Acute left-sided low back pain with left-sided sciatica  Strain of lumbar region, initial encounter     ED Discharge Orders         Ordered    predniSONE (STERAPRED UNI-PAK 21 TAB) 10 MG (21) TBPK tablet     04/19/18 0158    cyclobenzaprine (FLEXERIL) 10 MG tablet  3 times daily PRN     04/19/18 0158    lidocaine (LIDODERM) 5 %  Every 12 hours     04/19/18 0158           Note:  This document was prepared using Dragon voice recognition software and may include unintentional dictation errors.    Rebecka Apley, MD 04/19/18 [phone]

## 2018-04-19 NOTE — Discharge Instructions (Addendum)
Please follow-up with your primary care physician for further evaluation of your back pain. °

## 2018-08-21 IMAGING — CR DG ELBOW COMPLETE 3+V*L*
4 series · 4 of 4 positions shown · non-contrast
Comparison: None.

CLINICAL DATA: Elbow pain following fall several weeks ago, initial
encounter

EXAM:
LEFT ELBOW - COMPLETE 3+ VIEW

[view not recorded (1 of 4)]
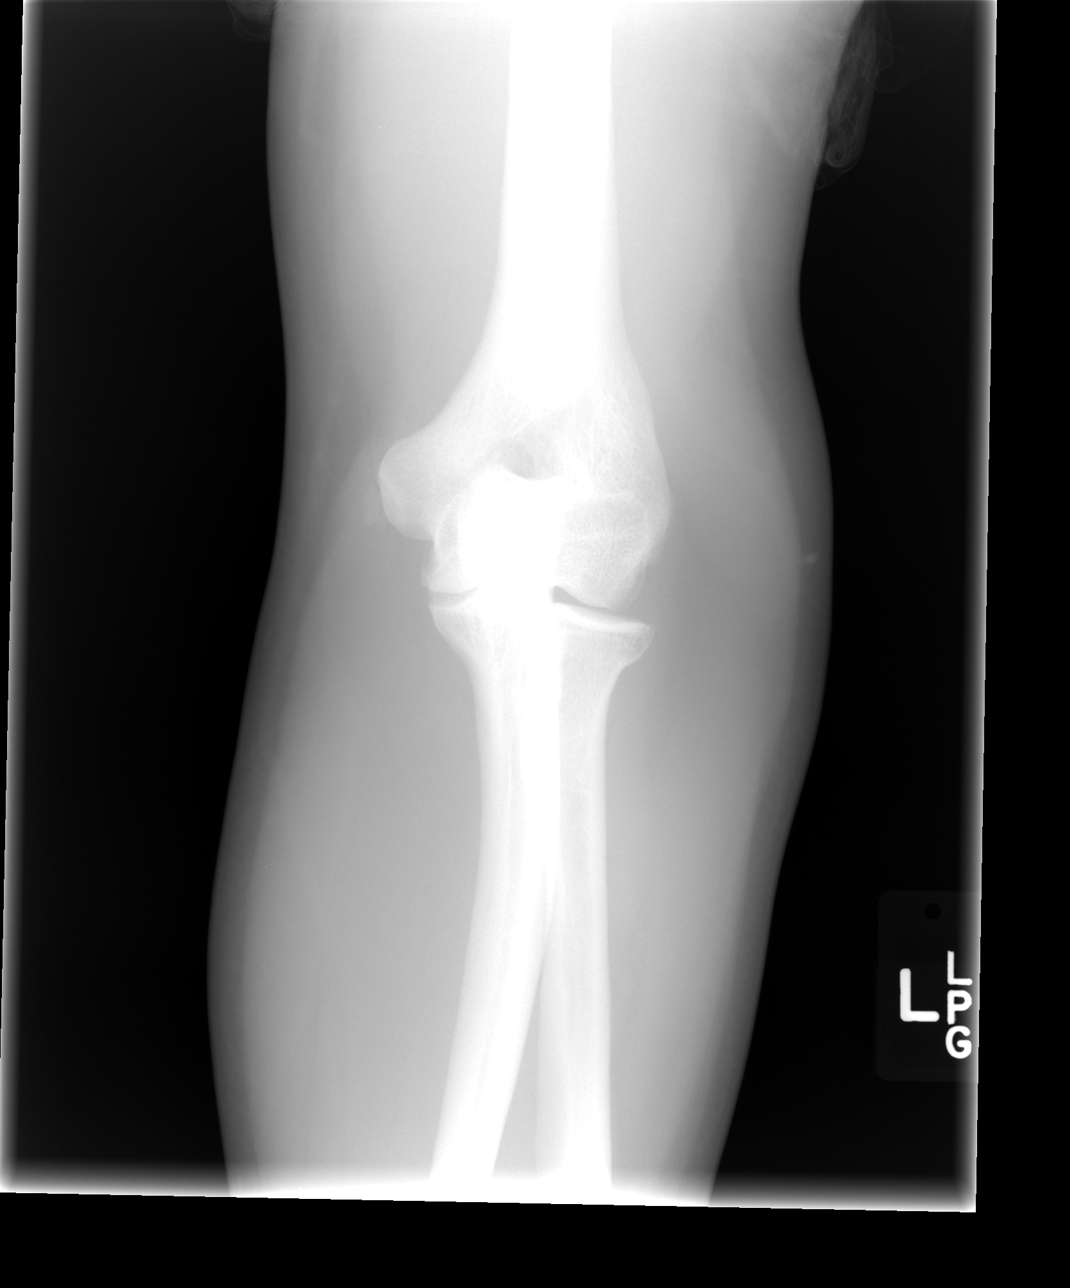

[view not recorded (2 of 4)]
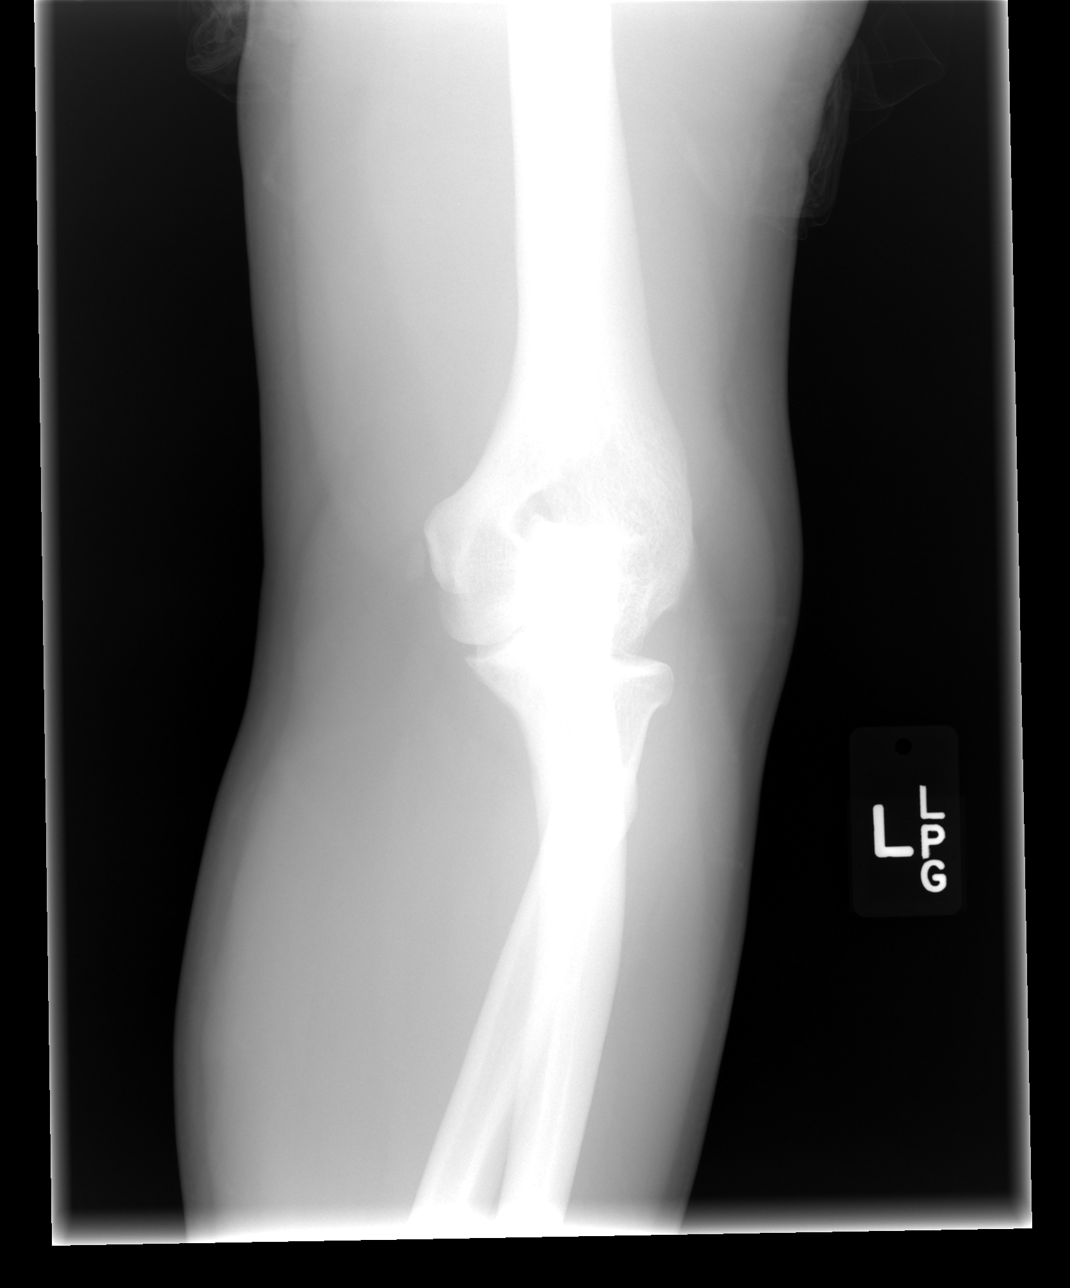

[view not recorded (3 of 4)]
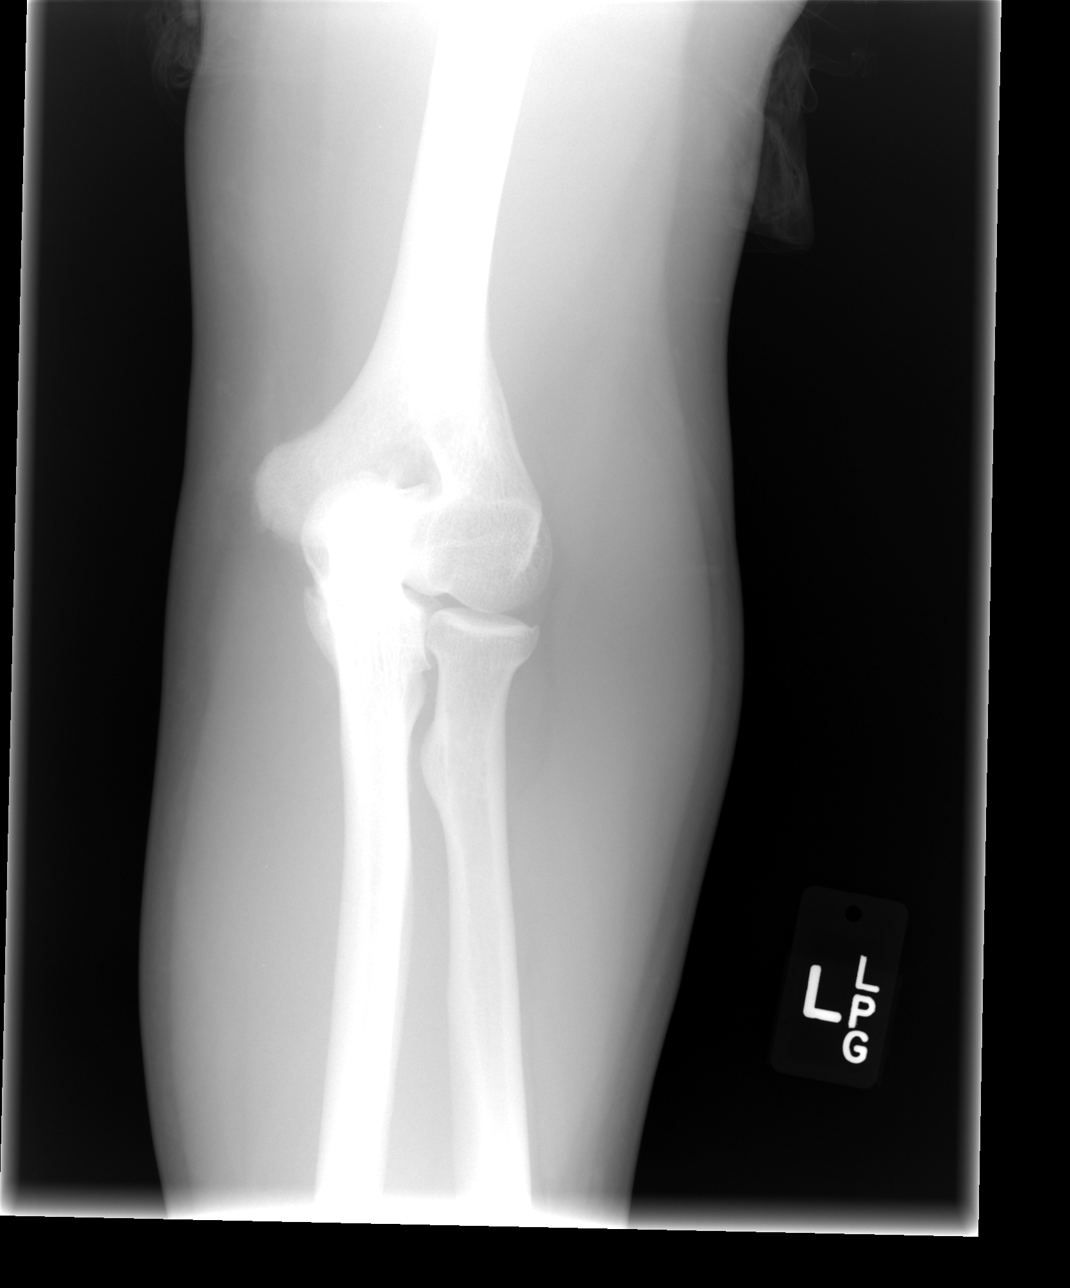

[view not recorded (4 of 4)]
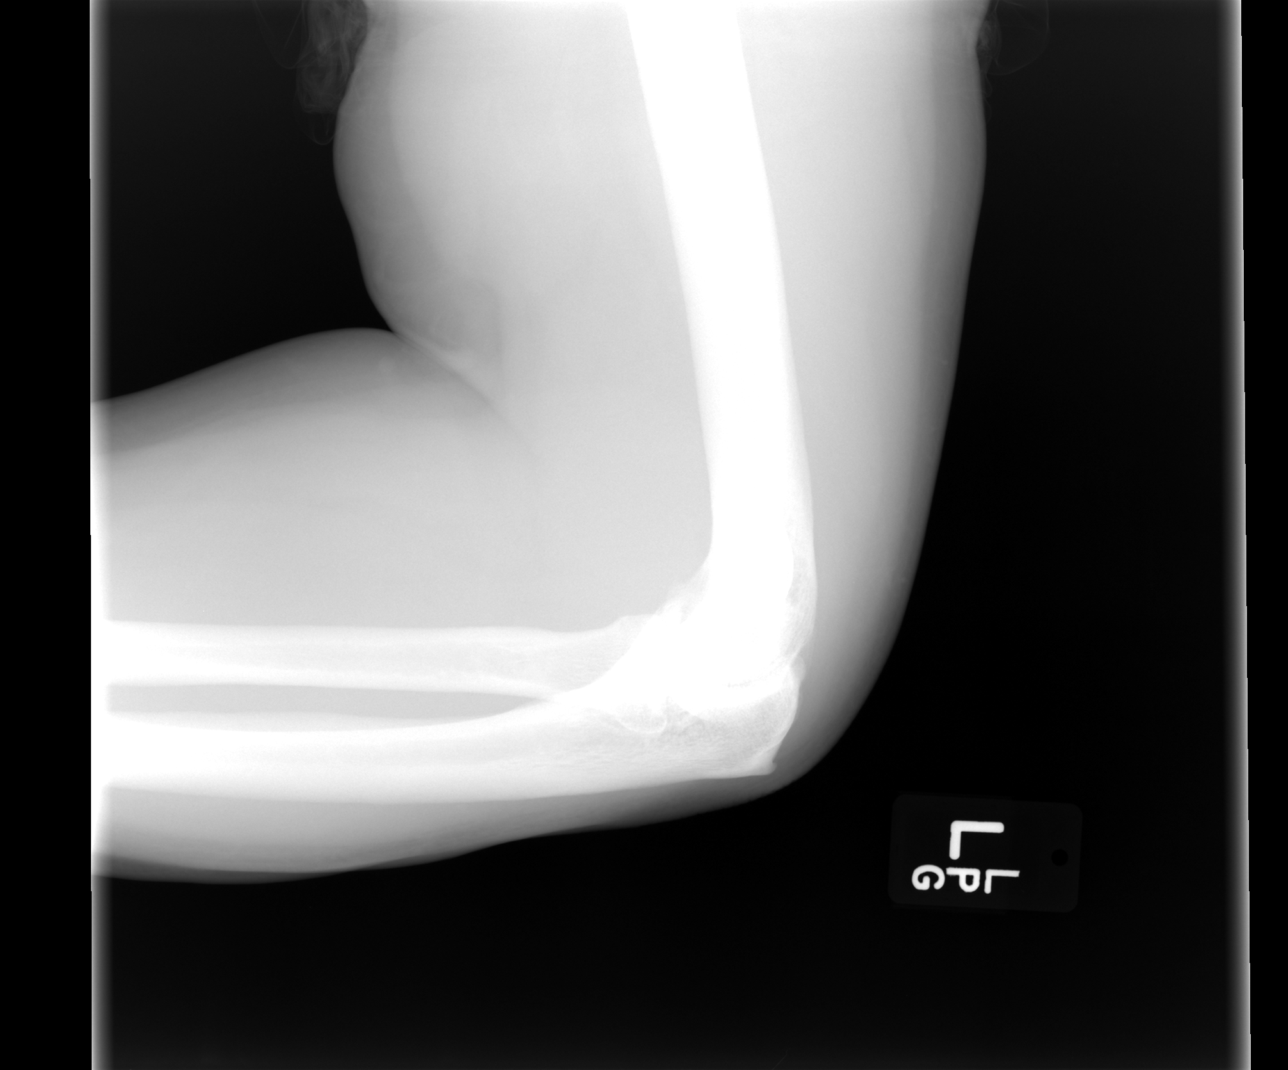

[4 of 4 positions shown; findings below may reference images not displayed]

FINDINGS: No acute fracture or dislocation is noted. No joint effusion is
seen. Minimal spurring is noted along the olecranon and radial head.
IMPRESSION: Minimal degenerative spurring.  No acute abnormality is noted.

## 2020-10-26 ENCOUNTER — Encounter: Payer: Self-pay | Admitting: Emergency Medicine

## 2020-10-26 ENCOUNTER — Other Ambulatory Visit: Payer: Self-pay

## 2020-10-26 ENCOUNTER — Emergency Department
Admission: EM | Admit: 2020-10-26 | Discharge: 2020-10-26 | Disposition: A | Discharge status: Home / Self Care | Payer: Medicaid Other | Attending: Emergency Medicine | Admitting: Emergency Medicine

## 2020-10-26 ENCOUNTER — Emergency Department
Admission: EM | Admit: 2020-10-26 | Discharge: 2020-10-27 | Disposition: A | Discharge status: Home / Self Care | Payer: Self-pay | Attending: Emergency Medicine | Admitting: Emergency Medicine

## 2020-10-26 DIAGNOSIS — Z7982 Long term (current) use of aspirin: Secondary | ICD-10-CM | POA: Insufficient documentation

## 2020-10-26 DIAGNOSIS — Z87891 Personal history of nicotine dependence: Secondary | ICD-10-CM | POA: Insufficient documentation

## 2020-10-26 DIAGNOSIS — E1165 Type 2 diabetes mellitus with hyperglycemia: Secondary | ICD-10-CM | POA: Insufficient documentation

## 2020-10-26 DIAGNOSIS — Z7984 Long term (current) use of oral hypoglycemic drugs: Secondary | ICD-10-CM | POA: Insufficient documentation

## 2020-10-26 DIAGNOSIS — Z79899 Other long term (current) drug therapy: Secondary | ICD-10-CM | POA: Insufficient documentation

## 2020-10-26 DIAGNOSIS — E119 Type 2 diabetes mellitus without complications: Secondary | ICD-10-CM

## 2020-10-26 DIAGNOSIS — I10 Essential (primary) hypertension: Secondary | ICD-10-CM | POA: Insufficient documentation

## 2020-10-26 DIAGNOSIS — E039 Hypothyroidism, unspecified: Secondary | ICD-10-CM | POA: Insufficient documentation

## 2020-10-26 DIAGNOSIS — R739 Hyperglycemia, unspecified: Secondary | ICD-10-CM

## 2020-10-26 LAB — URINALYSIS, COMPLETE (UACMP) WITH MICROSCOPIC
Bilirubin Urine: NEGATIVE
Glucose, UA: >=500 mg/dL — AB
Ketones, ur: 5 mg/dL — AB
Leukocytes,Ua: NEGATIVE
Nitrite: NEGATIVE
Protein, ur: NEGATIVE mg/dL
Specific Gravity, Urine: 1.03 (ref 1.005–1.030)
Squamous Epithelial / LPF: NONE SEEN (ref 0–5)
pH: 6 (ref 5.0–8.0)

## 2020-10-26 LAB — CBC WITH DIFFERENTIAL/PLATELET
Abs Immature Granulocytes: 0.02 10*3/uL (ref 0.00–0.07)
Basophils Absolute: 0.1 10*3/uL (ref 0.0–0.1)
Basophils Relative: 1 %
Eosinophils Absolute: 0.2 10*3/uL (ref 0.0–0.5)
Eosinophils Relative: 4 %
HCT: 34.9 % — ABNORMAL LOW (ref 39.0–52.0)
Hemoglobin: 10.3 g/dL — ABNORMAL LOW (ref 13.0–17.0)
Immature Granulocytes: 0 %
Lymphocytes Relative: 22 %
Lymphs Abs: 1.5 10*3/uL (ref 0.7–4.0)
MCH: 21 pg — ABNORMAL LOW (ref 26.0–34.0)
MCHC: 29.5 g/dL — ABNORMAL LOW (ref 30.0–36.0)
MCV: 71.1 fL — ABNORMAL LOW (ref 80.0–100.0)
Monocytes Absolute: 0.6 10*3/uL (ref 0.1–1.0)
Monocytes Relative: 9 %
Neutro Abs: 4.3 10*3/uL (ref 1.7–7.7)
Neutrophils Relative %: 64 %
Platelets: 263 10*3/uL (ref 150–400)
RBC: 4.91 MIL/uL (ref 4.22–5.81)
RDW: 16.3 % — ABNORMAL HIGH (ref 11.5–15.5)
WBC: 6.7 10*3/uL (ref 4.0–10.5)
nRBC: 0 % (ref 0.0–0.2)

## 2020-10-26 LAB — CBC
HCT: 34.5 % — ABNORMAL LOW (ref 39.0–52.0)
Hemoglobin: 10.3 g/dL — ABNORMAL LOW (ref 13.0–17.0)
MCH: 21.1 pg — ABNORMAL LOW (ref 26.0–34.0)
MCHC: 29.9 g/dL — ABNORMAL LOW (ref 30.0–36.0)
MCV: 70.6 fL — ABNORMAL LOW (ref 80.0–100.0)
Platelets: 260 10*3/uL (ref 150–400)
RBC: 4.89 MIL/uL (ref 4.22–5.81)
RDW: 16.3 % — ABNORMAL HIGH (ref 11.5–15.5)
WBC: 7.4 10*3/uL (ref 4.0–10.5)
nRBC: 0 % (ref 0.0–0.2)

## 2020-10-26 LAB — CBG MONITORING, ED
Glucose-Capillary: >600 mg/dL (ref 70–99)
Glucose-Capillary: 490 mg/dL — ABNORMAL HIGH (ref 70–99)
Glucose-Capillary: 401 mg/dL — ABNORMAL HIGH (ref 70–99)
Glucose-Capillary: 379 mg/dL — ABNORMAL HIGH (ref 70–99)
Glucose-Capillary: 594 mg/dL (ref 70–99)

## 2020-10-26 LAB — COMPREHENSIVE METABOLIC PANEL
ALT: 36 U/L (ref 0–44)
AST: 24 U/L (ref 15–41)
Albumin: 4.5 g/dL (ref 3.5–5.0)
Alkaline Phosphatase: 95 U/L (ref 38–126)
Anion gap: 9 (ref 5–15)
BUN: 20 mg/dL (ref 6–20)
CO2: 24 mmol/L (ref 22–32)
Calcium: 8.8 mg/dL — ABNORMAL LOW (ref 8.9–10.3)
Chloride: 94 mmol/L — ABNORMAL LOW (ref 98–111)
Creatinine, Ser: 1.17 mg/dL (ref 0.61–1.24)
GFR, Estimated: >60 mL/min (ref >60)
Glucose, Bld: 708 mg/dL (ref 70–99)
Potassium: 4.2 mmol/L (ref 3.5–5.1)
Sodium: 127 mmol/L — ABNORMAL LOW (ref 135–145)
Total Bilirubin: 0.9 mg/dL (ref 0.3–1.2)
Total Protein: 7.6 g/dL (ref 6.5–8.1)

## 2020-10-26 LAB — T4, FREE: Free T4: 0.89 ng/dL (ref 0.61–1.12)

## 2020-10-26 LAB — TSH: TSH: 5.822 u[IU]/mL — ABNORMAL HIGH (ref 0.350–4.500)

## 2020-10-26 MED ORDER — METFORMIN HCL 500 MG PO TABS: 500.0000 mg | ORAL_TABLET | Freq: Two times a day (BID) | ORAL | 1 refills | Status: DC

## 2020-10-26 MED ORDER — INSULIN ASPART 100 UNIT/ML ~~LOC~~ SOLN
5.0000 [IU] | Freq: Once | SUBCUTANEOUS | Status: AC
  Administered 2020-10-26: 5 [IU] via INTRAVENOUS
  Filled 2020-10-26: qty 1

## 2020-10-26 MED ORDER — INSULIN ASPART 100 UNIT/ML ~~LOC~~ SOLN
8.0000 [IU] | Freq: Once | SUBCUTANEOUS | Status: AC
  Administered 2020-10-27: 8 [IU] via INTRAVENOUS
  Filled 2020-10-26: qty 1

## 2020-10-26 MED ORDER — SODIUM CHLORIDE 0.9 % IV BOLUS (SEPSIS)
1000.0000 mL | Freq: Once | INTRAVENOUS | Status: AC
  Administered 2020-10-27: 1000 mL via INTRAVENOUS

## 2020-10-26 MED ORDER — LACTATED RINGERS IV BOLUS
1000.0000 mL | Freq: Once | INTRAVENOUS | Status: AC
  Administered 2020-10-26: 1000 mL via INTRAVENOUS

## 2020-10-26 MED ORDER — METFORMIN HCL 500 MG PO TABS: 500.0000 mg | ORAL_TABLET | Freq: Two times a day (BID) | ORAL | 1 refills | Status: AC

## 2020-10-26 NOTE — ED Provider Notes (Signed)
[facility] Emergency Department Provider Note   ____________________________________________   Event Date/Time   First MD Initiated Contact with Patient 10/26/20 [phone]     (approximate)  I have reviewed the triage vital signs and the nursing notes.   HISTORY  Chief Complaint Blurred Vision, Urinary Frequency, and Weakness    HPI Francisco Stuart is a 40 y.o. male with past medical history of hypertension, hypothyroidism, and anxiety who presents to the ED with weakness.  Patient reports that he has been feeling increasingly weak over the past month, has also noticed urinary frequency over that time.  Symptoms seem to come on after he was started on a course of steroids for gout.  He states he has been very thirsty over the past few days and feels like he has "[place]."  He denies any fevers, cough, chest pain, shortness of breath, or dysuria.        Past Medical History:  Diagnosis Date  . Anxiety   . History of hemorrhoids   . Hypertension   . Hypothyroidism   . Obesity   . Panic attacks     Patient Active Problem List   Diagnosis Date Noted  . Pain in left elbow 10/22/2016  . Chest pain 02/10/2012  . Dyspnea 02/10/2012    Past Surgical History:  Procedure Laterality Date  . COLONOSCOPY  2011    Prior to Admission medications   Medication Sig Start Date End Date Taking? Authorizing Provider  metFORMIN (GLUCOPHAGE) 500 MG tablet Take 1 tablet (500 mg total) by mouth 2 (two) times daily with a meal. 10/26/20 12/25/20 Yes Chesley Noon, MD  aspirin 81 MG tablet Take 81 mg by mouth daily.    [provider]  clonazePAM (KLONOPIN) 1 MG tablet Take 1 mg by mouth at bedtime as needed.    [provider]  cyclobenzaprine (FLEXERIL) 10 MG tablet Take 1 tablet (10 mg total) by mouth 3 (three) times daily as needed for muscle spasms. 04/19/18   Rebecka Apley, MD  diclofenac (VOLTAREN) 75 MG EC tablet Take 1 tablet (75  mg total) by mouth 2 (two) times daily. 11/05/16   Tarry Kos, MD  diclofenac sodium (VOLTAREN) 1 % GEL Apply 2 g topically 4 (four) times daily. 11/05/16   Tarry Kos, MD  ibuprofen (ADVIL,MOTRIN) 800 MG tablet Take 1 tablet (800 mg total) by mouth every 8 (eight) hours as needed for moderate pain. 02/03/16   Irean Hong, MD  levothyroxine (SYNTHROID, LEVOTHROID) 50 MCG tablet Take 1/2 tablet daily.    [provider]  metoprolol tartrate (LOPRESSOR) 25 MG tablet Take 25 mg by mouth 2 (two) times daily.    [provider]  predniSONE (STERAPRED UNI-PAK 21 TAB) 10 MG (21) TBPK tablet Take as directed 11/05/16   Tarry Kos, MD  predniSONE (STERAPRED UNI-PAK 21 TAB) 10 MG (21) TBPK tablet Take 6 tabs on day 1 Take 5 tabs on day 2 Take 4 tabs on day 3 Take 3 tabs on day 4 Take 2 tabs on day 5 Take 1 tab on day 6 04/19/18   Rebecka Apley, MD  traMADol (ULTRAM) 50 MG tablet Take 1 tablet (50 mg total) by mouth every 12 (twelve) hours as needed. 12/24/16   Tarry Kos, MD    Allergies Patient has no known allergies.  Family History  Problem Relation Age of Onset  . Heart attack Maternal Uncle 55  MI s/p CABG  . Heart attack Maternal Grandfather 20       MI    Social History Social History   Tobacco Use  . Smoking status: Former Smoker    Packs/day: 3.00    Years: 5.00    Pack years: 15.00    Types: Cigarettes  . Smokeless tobacco: Current User    Types: Chew  Substance Use Topics  . Alcohol use: Yes    Comment: Drinks an 18 pack of beer on weekends.  . Drug use: No    Types: Marijuana    Comment: smoked marijuana on occas. at a party.    Review of Systems  Constitutional: No fever/chills.  Positive generalized weakness and dizziness. Eyes: Positive for blurred vision. ENT: No sore throat. Cardiovascular: Denies chest pain. Respiratory: Denies shortness of breath. Gastrointestinal: No abdominal pain.  No nausea, no vomiting.  No diarrhea.   No constipation. Genitourinary: Negative for dysuria.  Positive for urinary frequency. Musculoskeletal: Negative for back pain. Skin: Negative for rash. Neurological: Negative for headaches, focal weakness or numbness.  ____________________________________________   PHYSICAL EXAM:  VITAL SIGNS: ED Triage Vitals [10/26/20 0229]  Enc Vitals Group     BP (!) 132/103     Pulse Rate 91     Resp 18     Temp 98.3 F (36.8 C)     Temp Source Oral     SpO2 98 %     Weight      Height      Head Circumference      Peak Flow      Pain Score      Pain Loc      Pain Edu?      Excl. in GC?     Constitutional: Alert and oriented. Eyes: Conjunctivae are normal. Head: Atraumatic. Nose: No congestion/rhinnorhea. Mouth/Throat: Mucous membranes are dry. Neck: Normal ROM Cardiovascular: Normal rate, regular rhythm. Grossly normal heart sounds. Respiratory: Normal respiratory effort.  No retractions. Lungs CTAB. Gastrointestinal: Soft and nontender. No distention. Genitourinary: deferred Musculoskeletal: No lower extremity tenderness nor edema. Neurologic:  Normal speech and language. No gross focal neurologic deficits are appreciated. Skin:  Skin is warm, dry and intact. No rash noted. Psychiatric: Mood and affect are normal. Speech and behavior are normal.  ____________________________________________   LABS (all labs ordered are listed, but only abnormal results are displayed)  Labs Reviewed  CBC WITH DIFFERENTIAL/PLATELET - Abnormal; Notable for the following components:      Result Value   Hemoglobin 10.3 (*)    HCT 34.9 (*)    MCV 71.1 (*)    MCH 21.0 (*)    MCHC 29.5 (*)    RDW 16.3 (*)    All other components within normal limits  COMPREHENSIVE METABOLIC PANEL - Abnormal; Notable for the following components:   Sodium 127 (*)    Chloride 94 (*)    Glucose, Bld 708 (*)    Calcium 8.8 (*)    All other components within normal limits  URINALYSIS, COMPLETE (UACMP) WITH  MICROSCOPIC - Abnormal; Notable for the following components:   Color, Urine COLORLESS (*)    APPearance CLEAR (*)    Glucose, UA >=500 (*)    Hgb urine dipstick SMALL (*)    Ketones, ur 5 (*)    Bacteria, UA RARE (*)    All other components within normal limits  TSH - Abnormal; Notable for the following components:   TSH 5.822 (*)    All other  components within normal limits  CBG MONITORING, ED - Abnormal; Notable for the following components:   Glucose-Capillary >600 (*)    All other components within normal limits  CBG MONITORING, ED - Abnormal; Notable for the following components:   Glucose-Capillary 490 (*)    All other components within normal limits  CBG MONITORING, ED - Abnormal; Notable for the following components:   Glucose-Capillary 401 (*)    All other components within normal limits  CBG MONITORING, ED - Abnormal; Notable for the following components:   Glucose-Capillary 379 (*)    All other components within normal limits  T4, FREE   ____________________________________________  EKG  ED ECG REPORT I, Chesley Noon, the attending physician, personally viewed and interpreted this ECG.   Date: 10/26/2020  EKG Time: 2:22  Rate: 93  Rhythm: normal sinus rhythm  Axis: Normal  Intervals:none  ST&T Change: None   PROCEDURES  Procedure(s) performed (including Critical Care):  Procedures   ____________________________________________   INITIAL IMPRESSION / ASSESSMENT AND PLAN / ED COURSE       40 year old male with past medical history of hypertension and hypothyroidism who presents to the ED with a couple weeks of generalized weakness, dizziness, urinary frequency, and polydipsia.  He was found to have a blood sugar of greater than 300 in triage, consistent with new onset diabetes.  We will hydrate with IV fluids and check electrolytes to assess for DKA.  He denies any symptoms of infectious process.  Labs remarkable for hyperglycemia but with no  acidosis to suggest DKA.  Blood sugar gradually improving following IV fluids and insulin.  Patient is appropriate for discharge home with close PCP follow-up, we will start him on Metformin.  He was counseled to return to the ED for any new worsening symptoms, patient agrees with plan.      ____________________________________________   FINAL CLINICAL IMPRESSION(S) / ED DIAGNOSES  Final diagnoses:  New onset type 2 diabetes mellitus (HCC)  Hypothyroidism, unspecified type     ED Discharge Orders         Ordered    metFORMIN (GLUCOPHAGE) 500 MG tablet  2 times daily with meals        10/26/20 0725           Note:  This document was prepared using Dragon voice recognition software and may include unintentional dictation errors.   Chesley Noon, MD 10/26/20 [phone]

## 2020-10-26 NOTE — ED Triage Notes (Signed)
Pt states is having hyperglycemia. Pt was here yesterday for same. Pt states his meter read over 600 for glucose. Pt states has had blurry vision, increased urination and thirst.

## 2020-10-26 NOTE — ED Triage Notes (Signed)
Pt c/o urinary frequency, blurred vision, weakness and dizziness x1 week. Pt unaware of DM hx.   CBG >600

## 2020-10-26 NOTE — ED Notes (Signed)
FIRST NURSE NOTE: ambulatory to desk c/o high blood sugar. States newly dx diabetic last night and sugar high again tonight. Pt alert and oriented X4, cooperative, RR even, color WNL. Pt in NAD. Given wheelchair. Wife present with patient

## 2020-10-26 NOTE — ED Notes (Signed)
Pt c/o of blurry distance vision, constant thirst, numbness to left foot, low energy  Denies hx of diabetes

## 2020-10-26 NOTE — ED Notes (Signed)
CRITICAL LAB: GLUCOSE is 708, [employer], Dr. Larinda Buttery notified, orders received

## 2020-10-27 LAB — URINALYSIS, COMPLETE (UACMP) WITH MICROSCOPIC
Bacteria, UA: NONE SEEN
Bilirubin Urine: NEGATIVE
Glucose, UA: >=500 mg/dL — AB
Ketones, ur: 20 mg/dL — AB
Leukocytes,Ua: NEGATIVE
Nitrite: NEGATIVE
Protein, ur: NEGATIVE mg/dL
Specific Gravity, Urine: 1.03 (ref 1.005–1.030)
Squamous Epithelial / LPF: NONE SEEN (ref 0–5)
WBC, UA: NONE SEEN WBC/hpf (ref 0–5)
pH: 6 (ref 5.0–8.0)

## 2020-10-27 LAB — BLOOD GAS, VENOUS
Acid-Base Excess: 1 mmol/L (ref 0.0–2.0)
Bicarbonate: 26 mmol/L (ref 20.0–28.0)
O2 Saturation: 80.7 %
Patient temperature: 37
pCO2, Ven: 42 mmHg — ABNORMAL LOW (ref 44.0–60.0)
pH, Ven: 7.4 (ref 7.250–7.430)
pO2, Ven: 45 mmHg (ref 32.0–45.0)

## 2020-10-27 LAB — COMPREHENSIVE METABOLIC PANEL
CO2: 21 mmol/L — ABNORMAL LOW (ref 22–32)
Creatinine, Ser: 1.12 mg/dL (ref 0.61–1.24)

## 2020-10-27 LAB — CBG MONITORING, ED
Glucose-Capillary: 340 mg/dL — ABNORMAL HIGH (ref 70–99)
Glucose-Capillary: 325 mg/dL — ABNORMAL HIGH (ref 70–99)
Glucose-Capillary: 262 mg/dL — ABNORMAL HIGH (ref 70–99)

## 2020-10-27 MED ORDER — INSULIN ASPART 100 UNIT/ML ~~LOC~~ SOLN
5.0000 [IU] | Freq: Once | SUBCUTANEOUS | Status: AC
  Administered 2020-10-27: 5 [IU] via INTRAVENOUS
  Filled 2020-10-27: qty 1

## 2020-10-27 MED ORDER — INSULIN ASPART 100 UNIT/ML ~~LOC~~ SOLN
6.0000 [IU] | Freq: Once | SUBCUTANEOUS | Status: AC
  Administered 2020-10-27: 6 [IU] via INTRAVENOUS
  Filled 2020-10-27: qty 1

## 2020-10-27 MED ORDER — SODIUM CHLORIDE 0.9 % IV BOLUS (SEPSIS)
1000.0000 mL | Freq: Once | INTRAVENOUS | Status: AC
  Administered 2020-10-27: 1000 mL via INTRAVENOUS

## 2020-10-27 NOTE — ED Notes (Signed)
Patient took of VS monitor.

## 2020-10-27 NOTE — ED Notes (Signed)
Peripheral IV discontinued. Catheter intact. No signs of infiltration or redness. Gauze applied to IV site.   Discharge instructions reviewed with patient. Questions fielded by this RN. Patient verbalizes understanding of instructions. Patient discharged home in stable condition per ward. No acute distress noted at time of discharge.

## 2020-10-27 NOTE — ED Notes (Signed)
ED Provider at bedside. 

## 2020-10-27 NOTE — ED Provider Notes (Signed)
[facility] Emergency Department Provider Note  ____________________________________________   Event Date/Time   First MD Initiated Contact with Patient 10/26/20 2314     (approximate)  I have reviewed the triage vital signs and the nursing notes.   HISTORY  Chief Complaint Hyperglycemia    HPI Francisco Stuart is a 40 y.o. male with recent diagnosis of diabetes who presents to the emergency department with concerns that his blood sugar was measuring over 600 at home tonight.  He states over the past month he has had generalized weakness, dizziness, blurry vision, fatigue, polydipsia and polyuria.  Was seen in the emergency department yesterday and was diagnosed with diabetes.  Was not in DKA at that time.  Discharged on Metformin 500 mg twice daily.  States he took 1 dose at home tonight.  Reports that he was discharged from the hospital he ate grilled chicken from [employer].  He went to dinner with his wife and had sushi, fried rice, grilled chicken, green beans.  He states this was around 6 PM.  At 9:30 PM he checked his blood sugar after talking to a friend of his who is a paramedic.  It was registering over 600 which concerned him so he came back to the emergency department.        Past Medical History:  Diagnosis Date  . Anxiety   . History of hemorrhoids   . Hypertension   . Hypothyroidism   . Obesity   . Panic attacks     Patient Active Problem List   Diagnosis Date Noted  . Pain in left elbow 10/22/2016  . Chest pain 02/10/2012  . Dyspnea 02/10/2012    Past Surgical History:  Procedure Laterality Date  . COLONOSCOPY  2011    Prior to Admission medications   Medication Sig Start Date End Date Taking? Authorizing Provider  aspirin 81 MG tablet Take 81 mg by mouth daily.    [provider]  clonazePAM (KLONOPIN) 1 MG tablet Take 1 mg by mouth at bedtime as needed.    [provider]  cyclobenzaprine (FLEXERIL) 10  MG tablet Take 1 tablet (10 mg total) by mouth 3 (three) times daily as needed for muscle spasms. 04/19/18   Rebecka Apley, MD  diclofenac (VOLTAREN) 75 MG EC tablet Take 1 tablet (75 mg total) by mouth 2 (two) times daily. 11/05/16   Tarry Kos, MD  diclofenac sodium (VOLTAREN) 1 % GEL Apply 2 g topically 4 (four) times daily. 11/05/16   Tarry Kos, MD  ibuprofen (ADVIL,MOTRIN) 800 MG tablet Take 1 tablet (800 mg total) by mouth every 8 (eight) hours as needed for moderate pain. 02/03/16   Irean Hong, MD  levothyroxine (SYNTHROID, LEVOTHROID) 50 MCG tablet Take 1/2 tablet daily.    [provider]  metFORMIN (GLUCOPHAGE) 500 MG tablet Take 1 tablet (500 mg total) by mouth 2 (two) times daily with a meal. 10/26/20 12/25/20  Merwyn Katos, MD  metoprolol tartrate (LOPRESSOR) 25 MG tablet Take 25 mg by mouth 2 (two) times daily.    [provider]  predniSONE (STERAPRED UNI-PAK 21 TAB) 10 MG (21) TBPK tablet Take as directed 11/05/16   Tarry Kos, MD  predniSONE (STERAPRED UNI-PAK 21 TAB) 10 MG (21) TBPK tablet Take 6 tabs on day 1 Take 5 tabs on day 2 Take 4 tabs on day 3 Take 3 tabs on day 4 Take 2 tabs on day 5 Take 1 tab on  day 6 04/19/18   Rebecka Apley, MD  traMADol (ULTRAM) 50 MG tablet Take 1 tablet (50 mg total) by mouth every 12 (twelve) hours as needed. 12/24/16   Tarry Kos, MD    Allergies Patient has no known allergies.  Family History  Problem Relation Age of Onset  . Heart attack Maternal Uncle 55       MI s/p CABG  . Heart attack Maternal Grandfather 55       MI    Social History Social History   Tobacco Use  . Smoking status: Former Smoker    Packs/day: 3.00    Years: 5.00    Pack years: 15.00    Types: Cigarettes  . Smokeless tobacco: Current User    Types: Chew  Substance Use Topics  . Alcohol use: Yes    Comment: Drinks an 18 pack of beer on weekends.  . Drug use: No    Types: Marijuana    Comment: smoked marijuana on  occas. at a party.    Review of Systems Constitutional: No fever. Eyes: No visual changes. ENT: No sore throat. Cardiovascular: Denies chest pain. Respiratory: Denies shortness of breath. Gastrointestinal: No nausea, vomiting, diarrhea. Genitourinary: Negative for dysuria. Musculoskeletal: Negative for back pain. Skin: Negative for rash. Neurological: Negative for focal weakness or numbness.  ____________________________________________   PHYSICAL EXAM:  VITAL SIGNS: ED Triage Vitals  Enc Vitals Group     BP 10/26/20 2244 (!) 173/102     Pulse Rate 10/26/20 2242 95     Resp 10/26/20 2242 16     Temp --      Temp Source 10/26/20 2242 Oral     SpO2 10/26/20 2242 98 %     Weight 10/26/20 2242 (!) 340 lb (154.2 kg)     Height 10/26/20 2242 6' (1.829 m)     Head Circumference --      Peak Flow --      Pain Score 10/26/20 2242 0     Pain Loc --      Pain Edu? --      Excl. in GC? --    CONSTITUTIONAL: Alert and oriented and responds appropriately to questions. Well-appearing; well-nourished, obese, in no distress HEAD: Normocephalic EYES: Conjunctivae clear, pupils appear equal, EOM appear intact ENT: normal nose; moist mucous membranes NECK: Supple, normal ROM CARD: RRR; S1 and S2 appreciated; no murmurs, no clicks, no rubs, no gallops RESP: Normal chest excursion without splinting or tachypnea; breath sounds clear and equal bilaterally; no wheezes, no rhonchi, no rales, no hypoxia or respiratory distress, speaking full sentences ABD/GI: Normal bowel sounds; non-distended; soft, non-tender, no rebound, no guarding, no peritoneal signs, no hepatosplenomegaly BACK: The back appears normal EXT: Normal ROM in all joints; no deformity noted, no edema; no cyanosis SKIN: Normal color for age and race; warm; no rash on exposed skin NEURO: Moves all extremities equally PSYCH: The patient's mood and manner are appropriate.  ____________________________________________    LABS (all labs ordered are listed, but only abnormal results are displayed)  Labs Reviewed  CBC - Abnormal; Notable for the following components:      Result Value   Hemoglobin 10.3 (*)    HCT 34.5 (*)    MCV 70.6 (*)    MCH 21.1 (*)    MCHC 29.9 (*)    RDW 16.3 (*)    All other components within normal limits  URINALYSIS, COMPLETE (UACMP) WITH MICROSCOPIC - Abnormal; Notable for the following components:  Color, Urine COLORLESS (*)    APPearance CLEAR (*)    Glucose, UA >=500 (*)    Hgb urine dipstick SMALL (*)    Ketones, ur 20 (*)    All other components within normal limits  COMPREHENSIVE METABOLIC PANEL - Abnormal; Notable for the following components:   Sodium 129 (*)    CO2 21 (*)    Glucose, Bld 599 (*)    Calcium 8.2 (*)    All other components within normal limits  BLOOD GAS, VENOUS - Abnormal; Notable for the following components:   pCO2, Ven 42 (*)    All other components within normal limits  CBG MONITORING, ED - Abnormal; Notable for the following components:   Glucose-Capillary 594 (*)    All other components within normal limits  CBG MONITORING, ED - Abnormal; Notable for the following components:   Glucose-Capillary 340 (*)    All other components within normal limits  CBG MONITORING, ED - Abnormal; Notable for the following components:   Glucose-Capillary 325 (*)    All other components within normal limits  CBG MONITORING, ED - Abnormal; Notable for the following components:   Glucose-Capillary 262 (*)    All other components within normal limits  CBG MONITORING, ED   ____________________________________________  EKG  none ____________________________________________  RADIOLOGY I, Tremaine Earwood, personally viewed and evaluated these images (plain radiographs) as part of my medical decision making, as well as reviewing the written report by the radiologist.  ED MD interpretation:  none  Official radiology report(s): No results  found.  ____________________________________________   PROCEDURES  Procedure(s) performed (including Critical Care):  Procedures  CRITICAL CARE Performed by: Rochele RaringKristen Grantham Hippert   Total critical care time: 40 minutes  Critical care time was exclusive of separately billable procedures and treating other patients.  Critical care was necessary to treat or prevent imminent or life-threatening deterioration.  Critical care was time spent personally by me on the following activities: development of treatment plan with patient and/or surrogate as well as nursing, discussions with consultants, evaluation of patient's response to treatment, examination of patient, obtaining history from patient or surrogate, ordering and performing treatments and interventions, ordering and review of laboratory studies, ordering and review of radiographic studies, pulse oximetry and re-evaluation of patient's condition.  ____________________________________________   INITIAL IMPRESSION / ASSESSMENT AND PLAN / ED COURSE  As part of my medical decision making, I reviewed the following data within the electronic MEDICAL RECORD NUMBER History obtained from family, Nursing notes reviewed and incorporated, Labs reviewed, Old chart reviewed and Notes from prior ED visits         Patient here with hyperglycemia.  Blood sugar here is 594.  Will check labs, urine to evaluate for possible DKA.  Will give IV fluids, insulin.  Patient is otherwise well-appearing.  We had lengthy discussion regarding diet changes and close follow-up with his PCP.  I do not consider this a treatment failure given he has only had 1 dose of Metformin.  ED PROGRESS  Patient's initial labs show a bicarb of 21 but a normal anion gap of 10.  He does have small ketones in his urine.  After IV fluids and IV insulin, blood glucose has improved to 340 and his VBG now shows a bicarb of 26 and a pH of 7.4.  I do not believe he is in DKA currently.  We will  continue IV fluids, insulin to hopefully get his blood sugar under 300 where it is better controlled so  that he does not bounce back to the emergency department.  5:35 AM  Pt's blood sugar is now 262.  I feel he is safe to be discharged and continue Metformin 500 mg twice daily and follow-up closely with his primary care physician.  Have given him lots of information regarding diet and exercise.  Patient and wife comfortable with plan.  At this time, I do not feel there is any life-threatening condition present. I have reviewed, interpreted and discussed all results (EKG, imaging, lab, urine as appropriate) and exam findings with patient/family. I have reviewed nursing notes and appropriate previous records.  I feel the patient is safe to be discharged home without further emergent workup and can continue workup as an outpatient as needed. Discussed usual and customary return precautions. Patient/family verbalize understanding and are comfortable with this plan.  Outpatient follow-up has been provided as needed. All questions have been answered.  ____________________________________________   FINAL CLINICAL IMPRESSION(S) / ED DIAGNOSES  Final diagnoses:  Hyperglycemia     ED Discharge Orders    None      *Please note:  DILLIAN FEIG was evaluated in Emergency Department on 10/27/2020 for the symptoms described in the history of present illness. He was evaluated in the context of the global COVID-19 pandemic, which necessitated consideration that the patient might be at risk for infection with the SARS-CoV-2 virus that causes COVID-19. Institutional protocols and algorithms that pertain to the evaluation of patients at risk for COVID-19 are in a state of rapid change based on information released by regulatory bodies including the CDC and federal and state organizations. These policies and algorithms were followed during the patient's care in the ED.  Some ED evaluations and interventions  may be delayed as a result of limited staffing during and the pandemic.*   Note:  This document was prepared using Dragon voice recognition software and may include unintentional dictation errors.   Mabel Unrein, Layla Maw, DO 10/27/20 [phone]

## 2020-10-27 NOTE — Discharge Instructions (Addendum)
I recommend that you take your blood sugar in the morning when you wake up before you eat and then 2 hours after each meal.  I recommend that you keep a log of this information so that you can present it to your primary care doctor.

## 2020-10-28 LAB — COMPREHENSIVE METABOLIC PANEL
ALT: 35 U/L (ref 0–44)
AST: 25 U/L (ref 15–41)
Albumin: 4 g/dL (ref 3.5–5.0)
Alkaline Phosphatase: 86 U/L (ref 38–126)
Anion gap: 10 (ref 5–15)
BUN: 19 mg/dL (ref 6–20)
Calcium: 8.2 mg/dL — ABNORMAL LOW (ref 8.9–10.3)
Chloride: 98 mmol/L (ref 98–111)
GFR, Estimated: >60 mL/min (ref >60)
Glucose, Bld: 599 mg/dL (ref 70–99)
Potassium: 4.1 mmol/L (ref 3.5–5.1)
Sodium: 129 mmol/L — ABNORMAL LOW (ref 135–145)
Total Bilirubin: 0.7 mg/dL (ref 0.3–1.2)
Total Protein: 6.9 g/dL (ref 6.5–8.1)
